# Patient Record
Sex: Male | Born: 1980 | Race: White | Hispanic: No | Marital: Married | Smoking: Former smoker
Health system: Southern US, Community
[De-identification: ages and names within clinical notes are randomized; demographics above are authoritative.]

## PROBLEM LIST (undated history)

## (undated) DIAGNOSIS — B029 Zoster without complications: Secondary | ICD-10-CM

## (undated) DIAGNOSIS — F419 Anxiety disorder, unspecified: Secondary | ICD-10-CM

## (undated) HISTORY — PX: NO PAST SURGERIES: SHX2092

## (undated) HISTORY — DX: Zoster without complications: B02.9

## (undated) HISTORY — DX: Anxiety disorder, unspecified: F41.9

---

## 2007-08-28 ENCOUNTER — Emergency Department: Payer: Self-pay | Admitting: Emergency Medicine

## 2008-07-10 ENCOUNTER — Inpatient Hospital Stay: Payer: Self-pay | Admitting: Psychiatry

## 2009-02-09 ENCOUNTER — Emergency Department (HOSPITAL_COMMUNITY): Admission: EM | Admit: 2009-02-09 | Discharge: 2009-02-09 | Payer: Self-pay | Admitting: Emergency Medicine

## 2010-08-31 LAB — POCT INFECTIOUS MONO SCREEN: Mono Screen: NEGATIVE

## 2010-08-31 LAB — POCT RAPID STREP A (OFFICE): Streptococcus, Group A Screen (Direct): NEGATIVE

## 2017-05-30 DIAGNOSIS — K529 Noninfective gastroenteritis and colitis, unspecified: Secondary | ICD-10-CM | POA: Insufficient documentation

## 2017-05-30 DIAGNOSIS — R112 Nausea with vomiting, unspecified: Secondary | ICD-10-CM | POA: Diagnosis present

## 2017-05-30 DIAGNOSIS — F17200 Nicotine dependence, unspecified, uncomplicated: Secondary | ICD-10-CM | POA: Diagnosis not present

## 2017-05-30 LAB — LIPASE, BLOOD: Lipase: 36 U/L (ref 11–51)

## 2017-05-30 LAB — CBC
HCT: 51.5 % (ref 40.0–52.0)
Hemoglobin: 17.2 g/dL (ref 13.0–18.0)
MCH: 31.7 pg (ref 26.0–34.0)
MCHC: 33.4 g/dL (ref 32.0–36.0)
MCV: 94.8 fL (ref 80.0–100.0)
PLATELETS: 321 10*3/uL (ref 150–440)
RBC: 5.43 MIL/uL (ref 4.40–5.90)
RDW: 12.3 % (ref 11.5–14.5)
WBC: 12.8 10*3/uL — AB (ref 3.8–10.6)

## 2017-05-30 LAB — COMPREHENSIVE METABOLIC PANEL
ALT: 21 U/L (ref 17–63)
AST: 21 U/L (ref 15–41)
Albumin: 4.2 g/dL (ref 3.5–5.0)
Alkaline Phosphatase: 136 U/L — ABNORMAL HIGH (ref 38–126)
Anion gap: 9 (ref 5–15)
BUN: 8 mg/dL (ref 6–20)
CO2: 29 mmol/L (ref 22–32)
Calcium: 8.9 mg/dL (ref 8.9–10.3)
Chloride: 104 mmol/L (ref 101–111)
Creatinine, Ser: 0.93 mg/dL (ref 0.61–1.24)
Glucose, Bld: 132 mg/dL — ABNORMAL HIGH (ref 65–99)
POTASSIUM: 5 mmol/L (ref 3.5–5.1)
Sodium: 142 mmol/L (ref 135–145)
Total Bilirubin: 0.4 mg/dL (ref 0.3–1.2)
Total Protein: 7.1 g/dL (ref 6.5–8.1)

## 2017-05-30 NOTE — ED Triage Notes (Signed)
Pt reports n/v/d x 6 days with increased gas.  Pt in NAD, ambulatory to triage.

## 2017-05-31 ENCOUNTER — Encounter: Payer: Self-pay | Admitting: Radiology

## 2017-05-31 ENCOUNTER — Emergency Department: Payer: Managed Care, Other (non HMO)

## 2017-05-31 ENCOUNTER — Emergency Department
Admission: EM | Admit: 2017-05-31 | Discharge: 2017-05-31 | Disposition: A | Discharge status: Home / Self Care | Payer: Managed Care, Other (non HMO) | Attending: Emergency Medicine | Admitting: Emergency Medicine

## 2017-05-31 DIAGNOSIS — R112 Nausea with vomiting, unspecified: Secondary | ICD-10-CM

## 2017-05-31 DIAGNOSIS — R1033 Periumbilical pain: Secondary | ICD-10-CM

## 2017-05-31 DIAGNOSIS — R197 Diarrhea, unspecified: Secondary | ICD-10-CM

## 2017-05-31 DIAGNOSIS — K529 Noninfective gastroenteritis and colitis, unspecified: Secondary | ICD-10-CM

## 2017-05-31 LAB — URINALYSIS, COMPLETE (UACMP) WITH MICROSCOPIC
Bacteria, UA: NONE SEEN
Bilirubin Urine: NEGATIVE
GLUCOSE, UA: NEGATIVE mg/dL
HGB URINE DIPSTICK: NEGATIVE
Ketones, ur: NEGATIVE mg/dL
Leukocytes, UA: NEGATIVE
NITRITE: NEGATIVE
PH: 5 (ref 5.0–8.0)
Protein, ur: NEGATIVE mg/dL
SPECIFIC GRAVITY, URINE: 1.023 (ref 1.005–1.030)
Squamous Epithelial / LPF: NONE SEEN

## 2017-05-31 LAB — GASTROINTESTINAL PANEL BY PCR, STOOL (REPLACES STOOL CULTURE)
ADENOVIRUS F40/41: NOT DETECTED
Astrovirus: NOT DETECTED
CAMPYLOBACTER SPECIES: NOT DETECTED
CRYPTOSPORIDIUM: NOT DETECTED
CYCLOSPORA CAYETANENSIS: NOT DETECTED
ENTEROPATHOGENIC E COLI (EPEC): NOT DETECTED
Entamoeba histolytica: NOT DETECTED
Enteroaggregative E coli (EAEC): NOT DETECTED
Enterotoxigenic E coli (ETEC): NOT DETECTED
Giardia lamblia: NOT DETECTED
Norovirus GI/GII: NOT DETECTED
PLESIMONAS SHIGELLOIDES: NOT DETECTED
ROTAVIRUS A: NOT DETECTED
SAPOVIRUS (I, II, IV, AND V): NOT DETECTED
SHIGA LIKE TOXIN PRODUCING E COLI (STEC): NOT DETECTED
Salmonella species: NOT DETECTED
Shigella/Enteroinvasive E coli (EIEC): NOT DETECTED
VIBRIO SPECIES: NOT DETECTED
Vibrio cholerae: NOT DETECTED
Yersinia enterocolitica: NOT DETECTED

## 2017-05-31 LAB — C DIFFICILE QUICK SCREEN W PCR REFLEX
C DIFFICILE (CDIFF) INTERP: NOT DETECTED
C Diff antigen: NEGATIVE
C Diff toxin: NEGATIVE

## 2017-05-31 MED ORDER — CIPROFLOXACIN HCL 500 MG PO TABS
500.0000 mg | ORAL_TABLET | Freq: Once | ORAL | Status: AC
  Administered 2017-05-31: 500 mg via ORAL
  Filled 2017-05-31: qty 1

## 2017-05-31 MED ORDER — IOPAMIDOL (ISOVUE-300) INJECTION 61%
100.0000 mL | Freq: Once | INTRAVENOUS | Status: AC | PRN
  Administered 2017-05-31: 100 mL via INTRAVENOUS

## 2017-05-31 MED ORDER — DICYCLOMINE HCL 20 MG PO TABS: 20.0000 mg | ORAL_TABLET | Freq: Four times a day (QID) | ORAL | 0 refills | Status: DC | PRN

## 2017-05-31 MED ORDER — METRONIDAZOLE 500 MG PO TABS: 500.0000 mg | ORAL_TABLET | Freq: Two times a day (BID) | ORAL | 0 refills | Status: DC

## 2017-05-31 MED ORDER — ONDANSETRON 4 MG PO TBDP: 4.0000 mg | ORAL_TABLET | Freq: Three times a day (TID) | ORAL | 0 refills | Status: DC | PRN

## 2017-05-31 MED ORDER — IOPAMIDOL (ISOVUE-300) INJECTION 61%
30.0000 mL | Freq: Once | INTRAVENOUS | Status: AC
  Administered 2017-05-31: 30 mL via ORAL

## 2017-05-31 MED ORDER — SODIUM CHLORIDE 0.9 % IV BOLUS (SEPSIS)
1000.0000 mL | Freq: Once | INTRAVENOUS | Status: AC
  Administered 2017-05-31: 1000 mL via INTRAVENOUS

## 2017-05-31 MED ORDER — CIPROFLOXACIN HCL 500 MG PO TABS: 500.0000 mg | ORAL_TABLET | Freq: Two times a day (BID) | ORAL | 0 refills | Status: DC

## 2017-05-31 MED ORDER — METRONIDAZOLE 500 MG PO TABS
500.0000 mg | ORAL_TABLET | Freq: Once | ORAL | Status: AC
Start: 2017-05-31 — End: 2017-05-31
  Administered 2017-05-31: 500 mg via ORAL
  Filled 2017-05-31: qty 1

## 2017-05-31 NOTE — Discharge Instructions (Signed)
1.  Take antibiotics as prescribed (Cipro/Flagyl 500 mg twice daily x 7 days). 2.  You may take medicines as needed for abdominal discomfort and nausea (Bentyl/Zofran #20). 3.  Clear liquids x 12 hours, then BRAT diet x 3 days, then slowly advance diet as tolerated. 4.  Return to the ER for worsening symptoms, persistent vomiting, difficulty breathing or other concerns.

## 2017-05-31 NOTE — ED Provider Notes (Signed)
[facility] Emergency Department Provider Note   ____________________________________________   First MD Initiated Contact with Patient 05/31/17 0245     (approximate)  I have reviewed the triage vital signs and the nursing notes.   HISTORY  Chief Complaint Emesis and Diarrhea    HPI Joseph Cohen is a 37 y.o. male who presents to the ED from home with a chief complaint of abdominal pain, nausea, vomiting and diarrhea.  Patient reports symptoms x 6 days; diarrhea worse than vomiting.  Traveled to [place] over the holidays and thought he picked up a stomach bug.  No other sick contacts.  Reports periumbilical cramping before onset of nausea and vomiting.  Denies associated fever, chills, chest pain, shortness of breath, dysuria.  Denies recent trauma or antibiotic use.   Past medical history None  There are no active problems to display for this patient.   History reviewed. No pertinent surgical history.  Prior to Admission medications   Not on File    Allergies Patient has no known allergies.  No family history on file.  Social History Social History   Tobacco Use  . Smoking status: Current Every Day Smoker  . Smokeless tobacco: Never Used  Substance Use Topics  . Alcohol use: No    Frequency: Never    Comment: no etoh x 1 year  . Drug use: No    Review of Systems  Constitutional: No fever/chills. Eyes: No visual changes. ENT: No sore throat. Cardiovascular: Denies chest pain. Respiratory: Denies shortness of breath. Gastrointestinal: Positive for abdominal pain, nausea, vomiting and diarrhea.  No constipation. Genitourinary: Negative for dysuria. Musculoskeletal: Negative for back pain. Skin: Negative for rash. Neurological: Negative for headaches, focal weakness or numbness.   ____________________________________________   PHYSICAL EXAM:  VITAL SIGNS: ED Triage Vitals  Enc Vitals Group     BP 05/30/17 2214  (!) 145/81     Pulse Rate 05/30/17 2214 62     Resp 05/30/17 2214 18     Temp 05/30/17 2214 98.2 F (36.8 C)     Temp Source 05/30/17 2214 Oral     SpO2 05/30/17 2214 100 %     Weight 05/30/17 2214 140 lb (63.5 kg)     Height --      Head Circumference --      Peak Flow --      Pain Score 05/30/17 2213 1     Pain Loc --      Pain Edu? --      Excl. in GC? --     Constitutional: Alert and oriented. Well appearing and in no acute distress. Eyes: Conjunctivae are normal. PERRL. EOMI. Head: Atraumatic. Nose: No congestion/rhinnorhea. Mouth/Throat: Mucous membranes are moist.  Oropharynx non-erythematous. Neck: No stridor.   Cardiovascular: Normal rate, regular rhythm. Grossly normal heart sounds.  Good peripheral circulation. Respiratory: Normal respiratory effort.  No retractions. Lungs CTAB. Gastrointestinal: Soft and minimally tender to palpation diffusely without rebound or guarding. No distention. No abdominal bruits. No CVA tenderness. Musculoskeletal: No lower extremity tenderness nor edema.  No joint effusions. Neurologic:  Normal speech and language. No gross focal neurologic deficits are appreciated. No gait instability. Skin:  Skin is warm, dry and intact. No rash noted. Psychiatric: Mood and affect are normal. Speech and behavior are normal.  ____________________________________________   LABS (all labs ordered are listed, but only abnormal results are displayed)  Labs Reviewed  COMPREHENSIVE METABOLIC PANEL - Abnormal; Notable for the following components:  Result Value   Glucose, Bld 132 (*)    Alkaline Phosphatase 136 (*)    All other components within normal limits  CBC - Abnormal; Notable for the following components:   WBC 12.8 (*)    All other components within normal limits  URINALYSIS, COMPLETE (UACMP) WITH MICROSCOPIC - Abnormal; Notable for the following components:   Color, Urine AMBER (*)    APPearance CLEAR (*)    All other components within  normal limits  C DIFFICILE QUICK SCREEN W PCR REFLEX  GASTROINTESTINAL PANEL BY PCR, STOOL (REPLACES STOOL CULTURE)  LIPASE, BLOOD   ____________________________________________  EKG  None ____________________________________________  RADIOLOGY  Ct Abdomen Pelvis W Contrast  Result Date: 05/31/2017 CLINICAL DATA:  Nausea, vomiting, and diarrhea for 6 days. Increased gas and epigastric discomfort. EXAM: CT ABDOMEN AND PELVIS WITH CONTRAST TECHNIQUE: Multidetector CT imaging of the abdomen and pelvis was performed using the standard protocol following bolus administration of intravenous contrast. CONTRAST:  100mL ISOVUE-300 IOPAMIDOL (ISOVUE-300) INJECTION 61% COMPARISON:  None. FINDINGS: Lower chest: Lung bases are clear. Hepatobiliary: Mild diffuse fatty infiltration of the liver. No focal lesions. Gallbladder and bile ducts are unremarkable. Pancreas: Unremarkable. No pancreatic ductal dilatation or surrounding inflammatory changes. Spleen: Normal in size without focal abnormality. Adrenals/Urinary Tract: Adrenal glands are unremarkable. Kidneys are normal, without renal calculi, focal lesion, or hydronephrosis. Bladder is unremarkable. Stomach/Bowel: Stomach, small bowel, and colon are not abnormally distended. Fluid-filled colon may be related to history of diarrhea. There is mild diffuse colonic wall thickening which likely indicates colitis. This could be infectious or inflammatory in etiology. Contrast material flows through the small bowel into the colon without evidence of small bowel obstruction. No small bowel wall thickening. Vascular/Lymphatic: No significant vascular findings are present. No pathologically enlarged abdominal or pelvic lymph nodes. Prominent mesenteric nodes are likely reactive. Reproductive: Prostate is unremarkable. Other: No abdominal wall hernia or abnormality. No abdominopelvic ascites. Musculoskeletal: No acute or significant osseous findings. IMPRESSION: Mild  diffuse colonic wall thickening with fluid-filled colon may indicate colitis. Consider infectious or inflammatory etiologies. No evidence of bowel obstruction. Mild diffuse fatty infiltration of the liver. Electronically Signed   By: Burman NievesWilliam  Stevens M.D.   On: 05/31/2017 03:56    ____________________________________________   PROCEDURES  Procedure(s) performed: None  Procedures  Critical Care performed: No  ____________________________________________   INITIAL IMPRESSION / ASSESSMENT AND PLAN / ED COURSE  As part of my medical decision making, I reviewed the following data within the electronic MEDICAL RECORD NUMBER History obtained from family, Nursing notes reviewed and incorporated, Labs reviewed, Old chart reviewed, Radiograph reviewed  and Notes from prior ED visits.   37 year old male who presents with a 6-day history of nausea, vomiting and diarrhea associated with abdominal cramps. Differential diagnosis includes, but is not limited to, acute appendicitis, renal colic, testicular torsion, urinary tract infection/pyelonephritis, prostatitis,  epididymitis, diverticulitis, small bowel obstruction or ileus, colitis, abdominal aortic aneurysm, gastroenteritis, hernia, etc.  Laboratory results notable for mild leukocytosis.  C. difficile and biofire stool panel ordered.  Although patient clinically appears well, given the duration of his symptoms, will obtain CT abdomen/pelvis to evaluate for intra-abdominal etiology of patient's symptoms.  Clinical Course as of May 31 538  Sat May 31, 2017  [id number] Patient resting in no acute distress.  Updated patient and spouse of laboratory and imaging results.  Patient was able to tolerate oral contrast without emesis.  Will initiate Cipro and Flagyl; prescriptions for analgesia and antiemetic to use as  needed.  Strict return precautions given.  Both verbalize understanding and agree with plan of care.  [JS]    Clinical Course User Index [JS] Irean Hong, MD     ____________________________________________   FINAL CLINICAL IMPRESSION(S) / ED DIAGNOSES  Final diagnoses:  Nausea vomiting and diarrhea  Periumbilical abdominal pain  Colitis     ED Discharge Orders    None       Note:  This document was prepared using Dragon voice recognition software and may include unintentional dictation errors.    Irean Hong, MD 05/31/17 0600

## 2017-05-31 NOTE — ED Notes (Signed)
Patient transported to CT 

## 2017-12-11 ENCOUNTER — Ambulatory Visit (INDEPENDENT_AMBULATORY_CARE_PROVIDER_SITE_OTHER): Payer: Managed Care, Other (non HMO) | Admitting: Family Medicine

## 2017-12-11 ENCOUNTER — Encounter: Payer: Self-pay | Admitting: Family Medicine

## 2017-12-11 VITALS — BP 112/76 | HR 59 | Temp 97.3°F | Resp 16 | Ht 68.5 in | Wt 142.0 lb

## 2017-12-11 DIAGNOSIS — F17200 Nicotine dependence, unspecified, uncomplicated: Secondary | ICD-10-CM

## 2017-12-11 DIAGNOSIS — Z23 Encounter for immunization: Secondary | ICD-10-CM

## 2017-12-11 DIAGNOSIS — Z13220 Encounter for screening for lipoid disorders: Secondary | ICD-10-CM

## 2017-12-11 DIAGNOSIS — B0229 Other postherpetic nervous system involvement: Secondary | ICD-10-CM | POA: Diagnosis not present

## 2017-12-11 DIAGNOSIS — Z114 Encounter for screening for human immunodeficiency virus [HIV]: Secondary | ICD-10-CM | POA: Diagnosis not present

## 2017-12-11 DIAGNOSIS — M720 Palmar fascial fibromatosis [Dupuytren]: Secondary | ICD-10-CM | POA: Diagnosis not present

## 2017-12-11 NOTE — Patient Instructions (Addendum)
Dupuytren Contracture Dupuytren contracture is a condition in which tissue under the skin of the palm becomes abnormally thickened. This causes one or more of the fingers to curl inward (contract) toward the palm. Eventually, the fingers may not be able to straighten out. This condition affects some or all of the fingers and the palm of the hand. It is often passed along from parent to child (inherited). Dupuytren contracture is a long-term (chronic) condition that develops (progresses) slowly over time. There is no cure, but symptoms can be managed and progression can be slowed with treatment. This condition is usually not dangerous or painful, but it can interfere with everyday tasks. What are the causes? This condition is caused by tissue (fascia) in the palm getting thicker and tighter. When the fascia thickens, it pulls on the cords of tissue (tendons) that control finger movement. This causes the fingers to contract. The cause of fascia thickening is not known. What increases the risk? This condition may be more likely to develop in:  People who are age 40 or older.  Men.  People with a family history of this condition.  People who use tobacco products, including cigarettes, chewing tobacco, and e-cigarettes.  People who drink alcohol excessively.  People with diabetes.  People with autoimmune diseases, such as HIV.  People with seizure disorders.  What are the signs or symptoms? Symptoms may develop in one or both hands. Any of the fingers can contract. The fingers farthest from the thumb are commonly affected. Usually, this condition is painless. You may have discomfort when holding or grabbing objects. Early symptoms of this condition may include:  Thick, puckered skin on the hand.  One or more lumps (nodules) on the palm. Nodules may be tender when they first appear, but they are generally painless.  Symptoms of this condition develop slowly over months or years. Later  symptoms of this condition may include:  Thick cords of tissue in the palm.  Fingers curled up toward the palm.  Inability to straighten the fingers into their normal position.  How is this diagnosed? This condition is diagnosed with a physical exam, which may include:  Looking at your hands and feeling your hands. This is to check for thickened fascia and nodules.  Measuring finger motion.  Doing the [place] Tabletop Test. You may be asked to try to put your hand on a surface, with your palm down and your fingers straight out.  How is this treated? There is no cure for this condition, but treatment can make symptoms more manageable and relieve discomfort. Treatment options may include:  Physical therapy. This can strengthen your hand and increase flexibility.  Occupational therapy. This can help you with everyday tasks that may be more difficult because of your condition.  A hand splint.  Shots (injections). Substances may be injected into your hand, such as: ? Medicines that help to decrease swelling (corticosteroids). ? Proteins (collagenase) to weaken thick tissue. After a collagenase injection, your health care provider may stretch your fingers.  Needle aponeurotomy. In this procedure, a needle is pushed through the skin and into the fascia. Moving the needle against the fascia can weaken or break up the thick tissue.  Surgery. This may be needed if your condition causes discomfort or interferes with everyday activities. Physical therapy is usually needed after surgery.  In some cases, symptoms never develop to the point of needing major treatment, and caring for yourself at home can be enough to manage your condition. Symptoms often   return after treatment. Follow these instructions at home: If you have a splint:  Do not put pressure on any part of the splint until it is fully hardened. This may take several hours.  Wear the splint as told by your health care provider.  Remove it only as told by your health care provider.  Loosen the splint if your fingers tingle, become numb, or turn cold and blue.  Do not let your splint get wet if it is not waterproof. ? If your splint is not waterproof, cover it with a watertight covering when you take a bath or a shower. ? Do not take baths, swim, or use a hot tub until your health care provider approves. Ask your health care provider if you can take showers. You may only be allowed to take sponge baths for bathing.  Keep the splint clean.  Ask your health care provider when it is safe to drive. Hand Care  Take these actions to help protect your hand from possible injury: ? Use tools that have padded grips. ? Wear protective gloves while you work with your hands. ? Avoid repetitive hand movements.  Avoid actions that cause pain or discomfort.  Stretch your hand by gently pulling your fingers backward toward your wrist. Do this as often as is comfortable. Stop if this causes pain.  Gently massage your hand as often as is comfortable.  If directed, apply heat to the affected area as often as told by your health care provider. Use the heat source that your health care provider recommends, such as a moist heat pack or a heating pad. ? Place a towel between your skin and the heat source. ? Leave the heat on for 20-30 minutes. ? Remove the heat if your skin turns bright red. This is especially important if you are unable to feel pain, heat, or cold. You may have a greater risk of getting burned. General instructions  Take over-the-counter and prescription medicines only as told by your health care provider.  Manage any other conditions that you have, such as diabetes.  If physical therapy was prescribed, do exercises as told by your health care provider.  Keep all follow-up visits as told by your health care provider. This is important. Contact a health care provider if:  You develop new symptoms, or your  symptoms get worse.  You have pain that gets worse or does not get better with medicine.  You have difficulty or discomfort with everyday tasks.  You have problems with your splint.  You develop numbness or tingling. Get help right away if:  You have severe pain.  Your fingers change color or become unusually cold. This information is not intended to replace advice given to you by your health care provider. Make sure you discuss any questions you have with your health care provider. Document Released: 03/10/2009 Document Revised: 06/27/2015 Document Reviewed: 10/05/2014 Elsevier Interactive Patient Education  2018 Elsevier Inc.  

## 2017-12-11 NOTE — Assessment & Plan Note (Signed)
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

## 2017-12-11 NOTE — Assessment & Plan Note (Signed)
Encourage patient on recently quitting smoking Continue to stepdown on nicotine therapy and eventually quit Patient also plans to eventually quit vaping Continue to monitor

## 2017-12-11 NOTE — Progress Notes (Signed)
Patient: Joseph Cohen, Male    DOB: 01/13/1981, 37 y.o.   MRN: [id number] Visit Date: 12/11/2017  Today's Provider: Shirlee LatchAngela Arijana Narayan, MD   I, Joslyn HyEmily Ratchford, CMA, am acting as scribe for Shirlee LatchAngela Mohogany Toppins, MD.  Chief Complaint  Patient presents with  . New Patient (Initial Visit)   Subjective:    Establish Care Joseph Cohen is a 37 y.o. male who presents today as a new patient to establish care. He feels fairly well. He reports exercising none, outside of work. He reports he is sleeping well.  He states that he has not seen a doctor in many years.  He is not sure when he last had a tetanus shot but believes it may have been when he was a child.  Pt is concerned about a "growth" in his left palm. Seems to have been getting larger for last 3 years.  Feels better with stretching.  No contractures or catching of finger with flexion/extension.  Non-tender.  He also recently had shingles on his right neck.  Was initially treated for a spider bite and then more rash broke out.  He was then treated with Valtrex.  It is no longer painful, but he has some intermittent numbness and pins-and-needles feelings.  He denies any hearing loss or residual rash.  Patient has smoked cigarettes for about 20 years.  He smoked about 1 pack/day.  He quit smoking about 5 weeks ago.  He is on step 2 of the nicotine patches which is 14 mg daily.  He does occasionally still vape. -----------------------------------------------------------------   Review of Systems  Constitutional: Negative.   HENT: Negative.   Eyes: Negative.   Respiratory: Negative.   Cardiovascular: Negative.   Gastrointestinal: Negative.   Endocrine: Negative.   Genitourinary: Negative.   Musculoskeletal: Negative.   Skin: Negative.   Allergic/Immunologic: Negative.   Neurological: Negative.   Hematological: Negative.   Psychiatric/Behavioral: Negative.     Social History      He  reports that he quit smoking  about 4 weeks ago. His smoking use included cigarettes. He has a 20.00 pack-year smoking history. He has never used smokeless tobacco. He reports that he drank alcohol. He reports that he does not use drugs.       Social History   Socioeconomic History  . Marital status: Single    Spouse name: Not on file  . Number of children: 1  . Years of education: Not on file  . Highest education level: GED or equivalent  Occupational History    Employer: [employer]  Social Needs  . Financial resource strain: Not on file  . Food insecurity:    Worry: Not on file    Inability: Not on file  . Transportation needs:    Medical: Not on file    Non-medical: Not on file  Tobacco Use  . Smoking status: Former Smoker    Packs/day: 1.00    Years: 20.00    Pack years: 20.00    Types: Cigarettes    Last attempt to quit: 11/11/2017    Years since quitting: 0.0  . Smokeless tobacco: Never Used  Substance and Sexual Activity  . Alcohol use: Not Currently    Frequency: Never  . Drug use: No  . Sexual activity: Yes    Partners: Female    Birth control/protection: None  Lifestyle  . Physical activity:    Days per week: Not on file    Minutes per session: Not  on file  . Stress: Not on file  Relationships  . Social connections:    Talks on phone: Not on file    Gets together: Not on file    Attends religious service: Not on file    Active member of club or organization: Not on file    Attends meetings of clubs or organizations: Not on file    Relationship status: Not on file  Other Topics Concern  . Not on file  Social History Narrative  . Not on file    Past Medical History:  Diagnosis Date  . Zoster without complications      Patient Active Problem List   Diagnosis Date Noted  . Postherpetic neuralgia 12/11/2017  . Dupuytren's disease of palm of left hand 12/11/2017  . Tobacco use disorder 12/11/2017    Past Surgical History:  Procedure Laterality Date  . NO  PAST SURGERIES      Family History        Family Status  Relation Name Status  . Mother  Alive  . Father  Alive  . Sister  Alive  . Sister  Alive  . MGM  (Not Specified)  . MGF  (Not Specified)  . PGM  (Not Specified)  . PGF  (Not Specified)  . Neg Hx  (Not Specified)        His family history includes Breast cancer in his maternal grandmother; Dementia in his maternal grandfather, maternal grandmother, paternal grandfather, and paternal grandmother; Healthy in his father, mother, sister, and sister. There is no history of Colon cancer or Prostate cancer.      No Known Allergies   Current Outpatient Medications:  .  nicotine (NICODERM CQ - DOSED IN MG/24 HOURS) 14 mg/24hr patch, Place 14 mg onto the skin daily., Disp: , Rfl:    Patient Care Team: Erasmo Downer, MD as PCP - General (Family Medicine)      Objective:   Vitals: BP 112/76 (BP Location: Left Arm, Patient Position: Sitting, Cuff Size: Normal)   Pulse (!) 59   Temp (!) 97.3 F (36.3 C) (Oral)   Resp 16   Ht 5' 8.5" (1.74 m)   Wt 142 lb (64.4 kg)   SpO2 99%   BMI 21.28 kg/m    Vitals:   12/11/17 1515  BP: 112/76  Pulse: (!) 59  Resp: 16  Temp: (!) 97.3 F (36.3 C)  TempSrc: Oral  SpO2: 99%  Weight: 142 lb (64.4 kg)  Height: 5' 8.5" (1.74 m)     Physical Exam  Constitutional: He is oriented to person, place, and time. He appears well-developed and well-nourished. No distress.  HENT:  Head: Normocephalic and atraumatic.  Right Ear: External ear normal.  Left Ear: External ear normal.  Nose: Nose normal.  Mouth/Throat: Oropharynx is clear and moist.  Eyes: Pupils are equal, round, and reactive to light. Conjunctivae and EOM are normal. No scleral icterus.  Neck: Neck supple. No thyromegaly present.  Cardiovascular: Normal rate, regular rhythm, normal heart sounds and intact distal pulses.  No murmur heard. Pulmonary/Chest: Effort normal and breath sounds normal. No respiratory distress.  He has no wheezes. He has no rales.  Abdominal: Soft. Bowel sounds are normal. He exhibits no distension. There is no tenderness. There is no rebound and no guarding.  Musculoskeletal: He exhibits no edema.  3 nodules over L palm below ring finger that are non-mobile.  Lymphadenopathy:    He has no cervical adenopathy.  Neurological: He is alert  and oriented to person, place, and time. A sensory deficit (over area of previous shingles) is present. No cranial nerve deficit. He exhibits normal muscle tone.  Skin: Skin is warm and dry. Capillary refill takes less than 2 seconds. No rash noted.  Psychiatric: He has a normal mood and affect. His behavior is normal.  Vitals reviewed.    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 Referral to hand surgery for possible corticosteroid injection and for further management      Relevant Orders   Ambulatory referral to Hand Surgery     Other   Tobacco use disorder    Encourage patient on recently quitting smoking Continue to stepdown on nicotine therapy and eventually quit Patient also plans to eventually quit vaping Continue to monitor      Relevant Orders   Comprehensive metabolic panel    Other Visit Diagnoses    Screening for HIV (human immunodeficiency virus)       Relevant Orders   HIV antibody (with reflex)   Screening for lipid disorders       Relevant Orders   Lipid panel   Comprehensive metabolic  panel   Need for Tdap vaccination       Relevant Orders   Tdap vaccine greater than or equal to 7yo IM (Completed)       Return in about 1 year (around 12/12/2018) for CPE.   The entirety of the information documented in the History of Present Illness, Review of Systems and Physical Exam were personally obtained by me. Portions of this information were initially documented by Irving Burton Ratchford, CMA and reviewed by me for thoroughness and accuracy.    Erasmo Downer, MD, MPH [facility] 12/11/2017 4:54 PM

## 2017-12-11 NOTE — Assessment & Plan Note (Signed)
Patient with mild post-herpetic neuralgia Offered gabapentin, but does not think it is severe enough to take a medication No cranial nerve deficits or hearing difficulty F/u prn

## 2017-12-13 LAB — COMPREHENSIVE METABOLIC PANEL
A/G RATIO: 2.4 — AB (ref 1.2–2.2)
ALK PHOS: 93 IU/L (ref 39–117)
ALT: 18 IU/L (ref 0–44)
AST: 15 IU/L (ref 0–40)
Albumin: 4.8 g/dL (ref 3.5–5.5)
BUN/Creatinine Ratio: 11 (ref 9–20)
BUN: 10 mg/dL (ref 6–20)
Bilirubin Total: 0.6 mg/dL (ref 0.0–1.2)
CHLORIDE: 105 mmol/L (ref 96–106)
CO2: 22 mmol/L (ref 20–29)
Calcium: 9.2 mg/dL (ref 8.7–10.2)
Creatinine, Ser: 0.89 mg/dL (ref 0.76–1.27)
GFR calc Af Amer: 126 mL/min/{1.73_m2} (ref >59)
GFR calc non Af Amer: 109 mL/min/{1.73_m2} (ref >59)
Globulin, Total: 2 g/dL (ref 1.5–4.5)
Glucose: 91 mg/dL (ref 65–99)
POTASSIUM: 4.5 mmol/L (ref 3.5–5.2)
SODIUM: 142 mmol/L (ref 134–144)
Total Protein: 6.8 g/dL (ref 6.0–8.5)

## 2017-12-13 LAB — LIPID PANEL
CHOLESTEROL TOTAL: 217 mg/dL — AB (ref 100–199)
Chol/HDL Ratio: 4.6 ratio (ref 0.0–5.0)
HDL: 47 mg/dL (ref >39)
LDL CALC: 142 mg/dL — AB (ref 0–99)
TRIGLYCERIDES: 140 mg/dL (ref 0–149)
VLDL CHOLESTEROL CAL: 28 mg/dL (ref 5–40)

## 2017-12-13 LAB — HIV ANTIBODY (ROUTINE TESTING W REFLEX): HIV SCREEN 4TH GENERATION: NONREACTIVE

## 2017-12-15 ENCOUNTER — Telehealth: Payer: Self-pay

## 2017-12-15 NOTE — Telephone Encounter (Signed)
-----   Message from Joseph DownerAngela M Bacigalupo, MD sent at 12/15/2017  8:19 AM EDT ----- Cholesterol is high, but 10 year risk of heart disease/stroke is low at 2.6%.  No medication indicated. Recommend regular exercise (30min 5x/wk) and diet low in saturated fats.  Negative HIV screening.  Normal kidney function, liver function, electrolytes.  Joseph DownerBacigalupo, Angela M, MD, MPH [facility] 12/15/2017 8:19 AM

## 2017-12-15 NOTE — Telephone Encounter (Signed)
Pt advised.

## 2018-12-18 ENCOUNTER — Ambulatory Visit: Payer: Managed Care, Other (non HMO) | Admitting: Family Medicine

## 2018-12-18 ENCOUNTER — Encounter: Payer: Self-pay | Admitting: Family Medicine

## 2018-12-25 ENCOUNTER — Encounter: Payer: Managed Care, Other (non HMO) | Admitting: Physician Assistant

## 2019-01-13 ENCOUNTER — Other Ambulatory Visit: Payer: Self-pay

## 2019-01-13 ENCOUNTER — Ambulatory Visit (INDEPENDENT_AMBULATORY_CARE_PROVIDER_SITE_OTHER): Payer: Managed Care, Other (non HMO) | Admitting: Family Medicine

## 2019-01-13 ENCOUNTER — Encounter: Payer: Self-pay | Admitting: Family Medicine

## 2019-01-13 VITALS — BP 120/80 | HR 71 | Temp 98.7°F | Ht 68.0 in | Wt 148.4 lb

## 2019-01-13 DIAGNOSIS — Z0000 Encounter for general adult medical examination without abnormal findings: Secondary | ICD-10-CM

## 2019-01-13 DIAGNOSIS — F410 Panic disorder [episodic paroxysmal anxiety] without agoraphobia: Secondary | ICD-10-CM | POA: Diagnosis not present

## 2019-01-13 NOTE — Progress Notes (Signed)
Patient: Joseph Cohen, Male    DOB: Jul 28, 1980, 38 y.o.   MRN: [id number] Visit Date: 01/13/2019  Today's Provider: Shirlee LatchAngela Komal Stangelo, MD   Chief Complaint  Patient presents with  . Annual Exam   Subjective:  I, Joseph Cohen CMA, am acting as a scribe for Shirlee LatchAngela Corrina Steffensen, MD.    Annual physical exam Joseph Cohen is a 38 y.o. male who presents today for health maintenance and complete physical. He feels well. He reports exercising includes walking. He reports he is sleeping well.  -----------------------------------------------------------------  Panic attack in some situtions Large groups of people Occurring Pre-COVID Able to remove himself from the situation and it Gets better  Nicotine patch step 2 currently Relapses some Quit smoking 2 months ago   Review of Systems  Constitutional: Negative.   HENT: Negative.   Eyes: Negative.   Cardiovascular: Negative.   Gastrointestinal: Negative.   Endocrine: Negative.   Genitourinary: Negative.   Musculoskeletal: Negative.   Skin: Negative.   Allergic/Immunologic: Negative.   Neurological: Negative.   Hematological: Negative.   Psychiatric/Behavioral: The patient is nervous/anxious.     Social History He  reports that he quit smoking about 14 months ago. His smoking use included cigarettes. He has a 20.00 pack-year smoking history. He has never used smokeless tobacco. He reports previous alcohol use. He reports that he does not use drugs. Social History   Socioeconomic History  . Marital status: Single    Spouse name: Not on file  . Number of children: 1  . Years of education: Not on file  . Highest education level: GED or equivalent  Occupational History    Employer: [employer]  Social Needs  . Financial resource strain: Not on file  . Food insecurity    Worry: Not on file    Inability: Not on file  . Transportation needs    Medical: Not on file    Non-medical: Not on  file  Tobacco Use  . Smoking status: Former Smoker    Packs/day: 1.00    Years: 20.00    Pack years: 20.00    Types: Cigarettes    Quit date: 11/11/2017    Years since quitting: 1.1  . Smokeless tobacco: Never Used  Substance and Sexual Activity  . Alcohol use: Not Currently    Frequency: Never  . Drug use: No  . Sexual activity: Yes    Partners: Female    Birth control/protection: None  Lifestyle  . Physical activity    Days per week: Not on file    Minutes per session: Not on file  . Stress: Not on file  Relationships  . Social Musicianconnections    Talks on phone: Not on file    Gets together: Not on file    Attends religious service: Not on file    Active member of club or organization: Not on file    Attends meetings of clubs or organizations: Not on file    Relationship status: Not on file  Other Topics Concern  . Not on file  Social History Narrative  . Not on file    Patient Active Problem List   Diagnosis Date Noted  . Postherpetic neuralgia 12/11/2017  . Dupuytren's disease of palm of left hand 12/11/2017  . Tobacco use disorder 12/11/2017    Past Surgical History:  Procedure Laterality Date  . NO PAST SURGERIES      Family History  Family Status  Relation Name Status  .  Mother  Alive  . Father  Alive  . Sister  Alive  . Sister  Alive  . MGM  (Not Specified)  . MGF  (Not Specified)  . PGM  (Not Specified)  . PGF  (Not Specified)  . Neg Hx  (Not Specified)   His family history includes Breast cancer in his maternal grandmother; Dementia in his maternal grandfather, maternal grandmother, paternal grandfather, and paternal grandmother; Healthy in his father, mother, sister, and sister.     No Known Allergies  Previous Medications   NICOTINE (NICODERM CQ - DOSED IN MG/24 HOURS) 14 MG/24HR PATCH    Place 14 mg onto the skin daily.    Patient Care Team: Virginia Crews, MD as PCP - General (Family Medicine)      Objective:   Vitals: BP  120/80 (BP Location: Right Arm, Patient Position: Sitting, Cuff Size: Normal)   Pulse 71   Temp 98.7 F (37.1 C) (Oral)   Ht 5\' 8"  (1.727 m)   Wt 148 lb 6.4 oz (67.3 kg)   SpO2 99%   BMI 22.56 kg/m    Physical Exam Vitals signs reviewed.  Constitutional:      General: He is not in acute distress.    Appearance: Normal appearance. He is well-developed. He is not diaphoretic.  HENT:     Head: Normocephalic and atraumatic.     Right Ear: Tympanic membrane, ear canal and external ear normal.     Left Ear: Tympanic membrane, ear canal and external ear normal.  Eyes:     General: No scleral icterus.    Extraocular Movements: Extraocular movements intact.     Conjunctiva/sclera: Conjunctivae normal.     Pupils: Pupils are equal, round, and reactive to light.  Neck:     Musculoskeletal: Neck supple.     Thyroid: No thyromegaly.  Cardiovascular:     Rate and Rhythm: Normal rate and regular rhythm.     Pulses: Normal pulses.     Heart sounds: Normal heart sounds. No murmur.  Pulmonary:     Effort: Pulmonary effort is normal. No respiratory distress.     Breath sounds: Normal breath sounds. No wheezing or rales.  Abdominal:     General: Bowel sounds are normal. There is no distension.     Palpations: Abdomen is soft.     Tenderness: There is no abdominal tenderness. There is no guarding or rebound.  Musculoskeletal:        General: No deformity.     Right lower leg: No edema.     Left lower leg: No edema.  Lymphadenopathy:     Cervical: No cervical adenopathy.  Skin:    General: Skin is warm and dry.     Capillary Refill: Capillary refill takes less than 2 seconds.     Findings: No rash.  Neurological:     Mental Status: He is alert and oriented to person, place, and time. Mental status is at baseline.  Psychiatric:        Mood and Affect: Mood normal.        Behavior: Behavior normal.        Thought Content: Thought content normal.      Depression Screen PHQ 2/9  Scores 01/13/2019 12/11/2017  PHQ - 2 Score 0 0  PHQ- 9 Score 0 -      Assessment & Plan:     Routine Health Maintenance and Physical Exam  Exercise Activities and Dietary recommendations Goals   None  Immunization History  Administered Date(s) Administered  . Tdap 12/11/2017    Health Maintenance  Topic Date Due  . INFLUENZA VACCINE  12/26/2018  . TETANUS/TDAP  12/12/2027  . HIV Screening  Completed     Discussed health benefits of physical activity, and encouraged him to engage in regular exercise appropriate for his age and condition.     Congratulated patient on quitting smoking --------------------------------------------------------------------  Problem List Items Addressed This Visit      Other   Panic attack    Only occurs in certain situations and able to avoid those Does not impact life Discussed therapy/medicaitons if becomes impactful on life       Other Visit Diagnoses    Encounter for annual physical exam    -  Primary   Relevant Orders   Lipid panel   Comprehensive metabolic panel       Return in about 1 year (around 01/13/2020) for CPE.   The entirety of the information documented in the History of Present Illness, Review of Systems and Physical Exam were personally obtained by me. Portions of this information were initially documented by [facility], CMA and reviewed by me for thoroughness and accuracy.    Solae Norling, Marzella SchleinAngela M, MD MPH [facility]

## 2019-01-13 NOTE — Assessment & Plan Note (Signed)
Only occurs in certain situations and able to avoid those Does not impact life Discussed therapy/medicaitons if becomes impactful on life

## 2019-01-13 NOTE — Patient Instructions (Signed)
Preventive Care 19-38 Years Old, Male Preventive care refers to lifestyle choices and visits with your health care provider that can promote health and wellness. This includes:  A yearly physical exam. This is also called an annual well check.  Regular dental and eye exams.  Immunizations.  Screening for certain conditions.  Healthy lifestyle choices, such as eating a healthy diet, getting regular exercise, not using drugs or products that contain nicotine and tobacco, and limiting alcohol use. What can I expect for my preventive care visit? Physical exam Your health care provider will check:  Height and weight. These may be used to calculate body mass index (BMI), which is a measurement that tells if you are at a healthy weight.  Heart rate and blood pressure.  Your skin for abnormal spots. Counseling Your health care provider may ask you questions about:  Alcohol, tobacco, and drug use.  Emotional well-being.  Home and relationship well-being.  Sexual activity.  Eating habits.  Work and work Statistician. What immunizations do I need?  Influenza (flu) vaccine  This is recommended every year. Tetanus, diphtheria, and pertussis (Tdap) vaccine  You may need a Td booster every 10 years. Varicella (chickenpox) vaccine  You may need this vaccine if you have not already been vaccinated. Human papillomavirus (HPV) vaccine  If recommended by your health care provider, you may need three doses over 6 months. Measles, mumps, and rubella (MMR) vaccine  You may need at least one dose of MMR. You may also need a second dose. Meningococcal conjugate (MenACWY) vaccine  One dose is recommended if you are 45-76 years old and a Market researcher living in a residence hall, or if you have one of several medical conditions. You may also need additional booster doses. Pneumococcal conjugate (PCV13) vaccine  You may need this if you have certain conditions and were not  previously vaccinated. Pneumococcal polysaccharide (PPSV23) vaccine  You may need one or two doses if you smoke cigarettes or if you have certain conditions. Hepatitis A vaccine  You may need this if you have certain conditions or if you travel or work in places where you may be exposed to hepatitis A. Hepatitis B vaccine  You may need this if you have certain conditions or if you travel or work in places where you may be exposed to hepatitis B. Haemophilus influenzae type b (Hib) vaccine  You may need this if you have certain risk factors. You may receive vaccines as individual doses or as more than one vaccine together in one shot (combination vaccines). Talk with your health care provider about the risks and benefits of combination vaccines. What tests do I need? Blood tests  Lipid and cholesterol levels. These may be checked every 5 years starting at age 17.  Hepatitis C test.  Hepatitis B test. Screening   Diabetes screening. This is done by checking your blood sugar (glucose) after you have not eaten for a while (fasting).  Sexually transmitted disease (STD) testing. Talk with your health care provider about your test results, treatment options, and if necessary, the need for more tests. Follow these instructions at home: Eating and drinking   Eat a diet that includes fresh fruits and vegetables, whole grains, lean protein, and low-fat dairy products.  Take vitamin and mineral supplements as recommended by your health care provider.  Do not drink alcohol if your health care provider tells you not to drink.  If you drink alcohol: ? Limit how much you have to 0-2  drinks a day. ? Be aware of how much alcohol is in your drink. In the [place] one drink equals one 12 oz bottle of beer (355 mL), one 5 oz glass of wine (148 mL), or one 1 oz glass of hard liquor (44 mL). Lifestyle  Take daily care of your teeth and gums.  Stay active. Exercise for at least 30 minutes on 5 or  more days each week.  Do not use any products that contain nicotine or tobacco, such as cigarettes, e-cigarettes, and chewing tobacco. If you need help quitting, ask your health care provider.  If you are sexually active, practice safe sex. Use a condom or other form of protection to prevent STIs (sexually transmitted infections). What's next?  Go to your health care provider once a year for a well check visit.  Ask your health care provider how often you should have your eyes and teeth checked.  Stay up to date on all vaccines. This information is not intended to replace advice given to you by your health care provider. Make sure you discuss any questions you have with your health care provider. Document Released: 07/09/2001 Document Revised: 05/07/2018 Document Reviewed: 05/07/2018 Elsevier Patient Education  2020 [employer]vier Inc.  

## 2019-01-15 ENCOUNTER — Telehealth: Payer: Self-pay

## 2019-01-15 LAB — COMPREHENSIVE METABOLIC PANEL
ALT: 37 IU/L (ref 0–44)
AST: 21 IU/L (ref 0–40)
Albumin/Globulin Ratio: 2.4 — ABNORMAL HIGH (ref 1.2–2.2)
Albumin: 4.6 g/dL (ref 4.0–5.0)
Alkaline Phosphatase: 101 IU/L (ref 39–117)
BUN/Creatinine Ratio: 11 (ref 9–20)
BUN: 12 mg/dL (ref 6–20)
Bilirubin Total: 0.4 mg/dL (ref 0.0–1.2)
CO2: 22 mmol/L (ref 20–29)
Calcium: 8.9 mg/dL (ref 8.7–10.2)
Chloride: 105 mmol/L (ref 96–106)
Creatinine, Ser: 1.07 mg/dL (ref 0.76–1.27)
GFR calc Af Amer: 101 mL/min/{1.73_m2} (ref >59)
GFR calc non Af Amer: 88 mL/min/{1.73_m2} (ref >59)
Globulin, Total: 1.9 g/dL (ref 1.5–4.5)
Glucose: 93 mg/dL (ref 65–99)
Potassium: 4.7 mmol/L (ref 3.5–5.2)
Sodium: 142 mmol/L (ref 134–144)
Total Protein: 6.5 g/dL (ref 6.0–8.5)

## 2019-01-15 LAB — LIPID PANEL
Chol/HDL Ratio: 4.7 ratio (ref 0.0–5.0)
Cholesterol, Total: 226 mg/dL — ABNORMAL HIGH (ref 100–199)
HDL: 48 mg/dL (ref >39)
LDL Calculated: 143 mg/dL — ABNORMAL HIGH (ref 0–99)
Triglycerides: 177 mg/dL — ABNORMAL HIGH (ref 0–149)
VLDL Cholesterol Cal: 35 mg/dL (ref 5–40)

## 2019-01-15 NOTE — Telephone Encounter (Signed)
LVMTRC 

## 2019-01-15 NOTE — Telephone Encounter (Signed)
-----   Message from Virginia Crews, MD sent at 01/15/2019  8:16 AM EDT ----- Normal labs. Cholesterol remains high, but not to level of needing a medication at this time.  recommend diet low in saturated fat and regular exercise - 30 min at least 5 times per week

## 2019-01-15 NOTE — Telephone Encounter (Signed)
Patient was advised.  

## 2020-01-13 ENCOUNTER — Other Ambulatory Visit: Payer: Self-pay

## 2020-01-13 ENCOUNTER — Ambulatory Visit (INDEPENDENT_AMBULATORY_CARE_PROVIDER_SITE_OTHER): Payer: Managed Care, Other (non HMO) | Admitting: Family Medicine

## 2020-01-13 ENCOUNTER — Encounter: Payer: Self-pay | Admitting: Family Medicine

## 2020-01-13 VITALS — BP 124/82 | HR 71 | Temp 98.3°F | Ht 68.0 in | Wt 157.0 lb

## 2020-01-13 DIAGNOSIS — Z0000 Encounter for general adult medical examination without abnormal findings: Secondary | ICD-10-CM | POA: Diagnosis not present

## 2020-01-13 DIAGNOSIS — Z1159 Encounter for screening for other viral diseases: Secondary | ICD-10-CM | POA: Diagnosis not present

## 2020-01-13 NOTE — Patient Instructions (Signed)
Preventive Care 19-39 Years Old, Male Preventive care refers to lifestyle choices and visits with your health care provider that can promote health and wellness. This includes:  A yearly physical exam. This is also called an annual well check.  Regular dental and eye exams.  Immunizations.  Screening for certain conditions.  Healthy lifestyle choices, such as eating a healthy diet, getting regular exercise, not using drugs or products that contain nicotine and tobacco, and limiting alcohol use. What can I expect for my preventive care visit? Physical exam Your health care provider will check:  Height and weight. These may be used to calculate body mass index (BMI), which is a measurement that tells if you are at a healthy weight.  Heart rate and blood pressure.  Your skin for abnormal spots. Counseling Your health care provider may ask you questions about:  Alcohol, tobacco, and drug use.  Emotional well-being.  Home and relationship well-being.  Sexual activity.  Eating habits.  Work and work Statistician. What immunizations do I need?  Influenza (flu) vaccine  This is recommended every year. Tetanus, diphtheria, and pertussis (Tdap) vaccine  You may need a Td booster every 10 years. Varicella (chickenpox) vaccine  You may need this vaccine if you have not already been vaccinated. Human papillomavirus (HPV) vaccine  If recommended by your health care provider, you may need three doses over 6 months. Measles, mumps, and rubella (MMR) vaccine  You may need at least one dose of MMR. You may also need a second dose. Meningococcal conjugate (MenACWY) vaccine  One dose is recommended if you are 45-76 years old and a Market researcher living in a residence hall, or if you have one of several medical conditions. You may also need additional booster doses. Pneumococcal conjugate (PCV13) vaccine  You may need this if you have certain conditions and were not  previously vaccinated. Pneumococcal polysaccharide (PPSV23) vaccine  You may need one or two doses if you smoke cigarettes or if you have certain conditions. Hepatitis A vaccine  You may need this if you have certain conditions or if you travel or work in places where you may be exposed to hepatitis A. Hepatitis B vaccine  You may need this if you have certain conditions or if you travel or work in places where you may be exposed to hepatitis B. Haemophilus influenzae type b (Hib) vaccine  You may need this if you have certain risk factors. You may receive vaccines as individual doses or as more than one vaccine together in one shot (combination vaccines). Talk with your health care provider about the risks and benefits of combination vaccines. What tests do I need? Blood tests  Lipid and cholesterol levels. These may be checked every 5 years starting at age 17.  Hepatitis C test.  Hepatitis B test. Screening   Diabetes screening. This is done by checking your blood sugar (glucose) after you have not eaten for a while (fasting).  Sexually transmitted disease (STD) testing. Talk with your health care provider about your test results, treatment options, and if necessary, the need for more tests. Follow these instructions at home: Eating and drinking   Eat a diet that includes fresh fruits and vegetables, whole grains, lean protein, and low-fat dairy products.  Take vitamin and mineral supplements as recommended by your health care provider.  Do not drink alcohol if your health care provider tells you not to drink.  If you drink alcohol: ? Limit how much you have to 0-2  drinks a day. ? Be aware of how much alcohol is in your drink. In the [place] one drink equals one 12 oz bottle of beer (355 mL), one 5 oz glass of wine (148 mL), or one 1 oz glass of hard liquor (44 mL). Lifestyle  Take daily care of your teeth and gums.  Stay active. Exercise for at least 30 minutes on 5 or  more days each week.  Do not use any products that contain nicotine or tobacco, such as cigarettes, e-cigarettes, and chewing tobacco. If you need help quitting, ask your health care provider.  If you are sexually active, practice safe sex. Use a condom or other form of protection to prevent STIs (sexually transmitted infections). What's next?  Go to your health care provider once a year for a well check visit.  Ask your health care provider how often you should have your eyes and teeth checked.  Stay up to date on all vaccines. This information is not intended to replace advice given to you by your health care provider. Make sure you discuss any questions you have with your health care provider. Document Revised: 05/07/2018 Document Reviewed: 05/07/2018 Elsevier Patient Education  2020 [employer].

## 2020-01-13 NOTE — Progress Notes (Signed)
I,Laura E Walsh,acting as a scribe for Shirlee Latch, MD.,have documented all relevant documentation on the behalf of Shirlee Latch, MD,as directed by  Shirlee Latch, MD while in the presence of Shirlee Latch, MD.   Complete physical exam   Patient: Joseph Cohen   DOB: 12-27-1980   39 y.o. Male  MRN: [id number] Visit Date: 01/13/2020  Today's healthcare provider: Shirlee Latch, MD   Chief Complaint  Patient presents with  . Annual Exam   Subjective    Joseph Cohen is a 39 y.o. male who presents today for a complete physical exam.  He reports consuming a general diet. Exercises regularly. He generally feels well. He reports sleeping well. He does not have additional problems to discuss today.  HPI    Past Medical History:  Diagnosis Date  . Zoster without complications    Past Surgical History:  Procedure Laterality Date  . NO PAST SURGERIES     Social History   Socioeconomic History  . Marital status: Single    Spouse name: Not on file  . Number of children: 1  . Years of education: Not on file  . Highest education level: GED or equivalent  Occupational History    Employer: [employer]  Tobacco Use  . Smoking status: Former Smoker    Packs/day: 1.00    Years: 20.00    Pack years: 20.00    Types: Cigarettes    Quit date: 11/11/2017    Years since quitting: 2.1  . Smokeless tobacco: Never Used  Vaping Use  . Vaping Use: Former  Substance and Sexual Activity  . Alcohol use: Not Currently  . Drug use: No  . Sexual activity: Yes    Partners: Female    Birth control/protection: None  Other Topics Concern  . Not on file  Social History Narrative  . Not on file   Social Determinants of Health   Financial Resource Strain:   . Difficulty of Paying Living Expenses: Not on file  Food Insecurity:   . Worried About Programme researcher, broadcasting/film/video in the Last Year: Not on file  . Ran Out of Food in the Last Year: Not on file    Transportation Needs:   . Lack of Transportation (Medical): Not on file  . Lack of Transportation (Non-Medical): Not on file  Physical Activity:   . Days of Exercise per Week: Not on file  . Minutes of Exercise per Session: Not on file  Stress:   . Feeling of Stress : Not on file  Social Connections:   . Frequency of Communication with Friends and Family: Not on file  . Frequency of Social Gatherings with Friends and Family: Not on file  . Attends Religious Services: Not on file  . Active Member of Clubs or Organizations: Not on file  . Attends Banker Meetings: Not on file  . Marital Status: Not on file  Intimate Partner Violence:   . Fear of Current or Ex-Partner: Not on file  . Emotionally Abused: Not on file  . Physically Abused: Not on file  . Sexually Abused: Not on file   Family Status  Relation Name Status  . Mother  Alive  . Father  Alive  . Sister  Alive  . Sister  Alive  . MGM  Deceased  . MGF  Deceased  . PGM  Deceased  . PGF  Deceased  . Neg Hx  (Not Specified)   Family History  Problem Relation Age of  Onset  . Healthy Mother   . Healthy Father   . Healthy Sister   . Healthy Sister   . Dementia Maternal Grandmother   . Breast cancer Maternal Grandmother   . Dementia Maternal Grandfather   . Dementia Paternal Grandmother   . Dementia Paternal Grandfather   . Colon cancer Neg Hx   . Prostate cancer Neg Hx    No Known Allergies  Patient Care Team: Erasmo Downer, MD as PCP - General (Family Medicine)   Medications: Outpatient Medications Prior to Visit  Medication Sig  . nicotine (NICODERM CQ - DOSED IN MG/24 HOURS) 14 mg/24hr patch Place 14 mg onto the skin daily.   No facility-administered medications prior to visit.    Review of Systems  Constitutional: Negative.   HENT: Negative.   Eyes: Negative.   Respiratory: Negative.   Cardiovascular: Negative.   Gastrointestinal: Negative.   Endocrine: Negative.    Genitourinary: Negative.   Musculoskeletal: Negative.   Skin: Negative.   Allergic/Immunologic: Negative.   Neurological: Negative.   Hematological: Negative.   Psychiatric/Behavioral: Negative.     Last CBC Lab Results  Component Value Date   WBC 12.8 (H) 05/30/2017   HGB 17.2 05/30/2017   HCT 51.5 05/30/2017   MCV 94.8 05/30/2017   MCH 31.7 05/30/2017   RDW 12.3 05/30/2017   PLT 321 05/30/2017   Last metabolic panel Lab Results  Component Value Date   GLUCOSE 93 01/14/2019   NA 142 01/14/2019   K 4.7 01/14/2019   CL 105 01/14/2019   CO2 22 01/14/2019   BUN 12 01/14/2019   CREATININE 1.07 01/14/2019   GFRNONAA 88 01/14/2019   GFRAA 101 01/14/2019   CALCIUM 8.9 01/14/2019   PROT 6.5 01/14/2019   ALBUMIN 4.6 01/14/2019   LABGLOB 1.9 01/14/2019   AGRATIO 2.4 (H) 01/14/2019   BILITOT 0.4 01/14/2019   ALKPHOS 101 01/14/2019   AST 21 01/14/2019   ALT 37 01/14/2019   ANIONGAP 9 05/30/2017   Last lipids Lab Results  Component Value Date   CHOL 226 (H) 01/14/2019   HDL 48 01/14/2019   LDLCALC 143 (H) 01/14/2019   TRIG 177 (H) 01/14/2019   CHOLHDL 4.7 01/14/2019   Last hemoglobin A1c No results found for: HGBA1C Last thyroid functions No results found for: TSH, T3TOTAL, T4TOTAL, THYROIDAB  Objective    BP 124/82 (BP Location: Left Arm, Patient Position: Sitting, Cuff Size: Large)   Pulse 71   Temp 98.3 F (36.8 C) (Oral)   Ht 5\' 8"  (1.727 m)   Wt 157 lb (71.2 kg)   SpO2 97%   BMI 23.87 kg/m    Physical Exam Constitutional:      Appearance: Normal appearance.  HENT:     Head: Normocephalic and atraumatic.     Right Ear: Tympanic membrane, ear canal and external ear normal.     Left Ear: Tympanic membrane, ear canal and external ear normal.  Eyes:     Extraocular Movements: Extraocular movements intact.     Conjunctiva/sclera: Conjunctivae normal.     Pupils: Pupils are equal, round, and reactive to light.  Cardiovascular:     Rate and Rhythm:  Normal rate and regular rhythm.     Pulses: Normal pulses.     Heart sounds: Normal heart sounds.  Pulmonary:     Effort: Pulmonary effort is normal.     Breath sounds: Normal breath sounds.  Abdominal:     Palpations: Abdomen is soft.  Tenderness: There is no abdominal tenderness.  Musculoskeletal:     Cervical back: Neck supple. No tenderness.     Right lower leg: No edema.     Left lower leg: No edema.  Lymphadenopathy:     Cervical: No cervical adenopathy.  Skin:    General: Skin is warm and dry.  Neurological:     Mental Status: He is alert and oriented to person, place, and time. Mental status is at baseline.  Psychiatric:        Mood and Affect: Mood normal.        Behavior: Behavior normal.        Thought Content: Thought content normal.        Judgment: Judgment normal.      Last depression screening scores PHQ 2/9 Scores 01/13/2020 01/13/2019 12/11/2017  PHQ - 2 Score 0 0 0  PHQ- 9 Score 0 0 -   Last fall risk screening Fall Risk  01/13/2020  Falls in the past year? 0  Number falls in past yr: 0  Injury with Fall? 0  Follow up Falls evaluation completed   Last Audit-C alcohol use screening Alcohol Use Disorder Test (AUDIT) 01/13/2020  1. How often do you have a drink containing alcohol? 0  2. How many drinks containing alcohol do you have on a typical day when you are drinking? 0  3. How often do you have six or more drinks on one occasion? 0  AUDIT-C Score 0  Alcohol Brief Interventions/Follow-up AUDIT Score <7 follow-up not indicated   A score of 3 or more in women, and 4 or more in men indicates increased risk for alcohol abuse, EXCEPT if all of the points are from question 1   No results found for any visits on 01/13/20.  Assessment & Plan    Routine Health Maintenance and Physical Exam  Exercise Activities and Dietary recommendations Goals   None     Immunization History  Administered Date(s) Administered  . Moderna SARS-COVID-2 Vaccination  09/02/2019, 09/30/2019  . Tdap 12/11/2017    Health Maintenance  Topic Date Due  . Hepatitis C Screening  Never done  . INFLUENZA VACCINE  12/26/2019  . TETANUS/TDAP  12/12/2027  . COVID-19 Vaccine  Completed  . HIV Screening  Completed    Discussed health benefits of physical activity, and encouraged him to engage in regular exercise appropriate for his age and condition.  Problem List Items Addressed This Visit    None    Visit Diagnoses    Annual physical exam    -  Primary   Relevant Orders   Comprehensive metabolic panel   Lipid panel   Need for hepatitis C screening test       Relevant Orders   Hepatitis C Antibody       Return in about 1 year (around 01/12/2021) for CPE.     I, Shirlee Latch, MD, have reviewed all documentation for this visit. The documentation on 01/13/20 for the exam, diagnosis, procedures, and orders are all accurate and complete.   Aeric Burnham, Marzella Schlein, MD, MPH [facility]

## 2020-01-14 ENCOUNTER — Telehealth: Payer: Self-pay

## 2020-01-14 LAB — COMPREHENSIVE METABOLIC PANEL
ALT: 18 IU/L (ref 0–44)
AST: 17 IU/L (ref 0–40)
Albumin/Globulin Ratio: 1.9 (ref 1.2–2.2)
Albumin: 4.5 g/dL (ref 4.0–5.0)
Alkaline Phosphatase: 121 IU/L (ref 48–121)
BUN/Creatinine Ratio: 14 (ref 9–20)
BUN: 11 mg/dL (ref 6–20)
Bilirubin Total: 0.4 mg/dL (ref 0.0–1.2)
CO2: 20 mmol/L (ref 20–29)
Calcium: 9.2 mg/dL (ref 8.7–10.2)
Chloride: 102 mmol/L (ref 96–106)
Creatinine, Ser: 0.81 mg/dL (ref 0.76–1.27)
GFR calc Af Amer: 129 mL/min/{1.73_m2} (ref >59)
GFR calc non Af Amer: 112 mL/min/{1.73_m2} (ref >59)
Globulin, Total: 2.4 g/dL (ref 1.5–4.5)
Glucose: 87 mg/dL (ref 65–99)
Potassium: 4.3 mmol/L (ref 3.5–5.2)
Sodium: 139 mmol/L (ref 134–144)
Total Protein: 6.9 g/dL (ref 6.0–8.5)

## 2020-01-14 LAB — LIPID PANEL
Chol/HDL Ratio: 5.5 ratio — ABNORMAL HIGH (ref 0.0–5.0)
Cholesterol, Total: 237 mg/dL — ABNORMAL HIGH (ref 100–199)
HDL: 43 mg/dL (ref >39)
LDL Chol Calc (NIH): 148 mg/dL — ABNORMAL HIGH (ref 0–99)
Triglycerides: 253 mg/dL — ABNORMAL HIGH (ref 0–149)
VLDL Cholesterol Cal: 46 mg/dL — ABNORMAL HIGH (ref 5–40)

## 2020-01-14 LAB — HEPATITIS C ANTIBODY: Hep C Virus Ab: <0.1 s/co ratio (ref 0.0–0.9)

## 2020-01-14 NOTE — Telephone Encounter (Signed)
Seen by patient Zigmund Gottron on 01/14/2020 10:59 AM

## 2020-01-14 NOTE — Telephone Encounter (Signed)
-----   Message from Erasmo Downer, MD sent at 01/14/2020  8:18 AM EDT ----- Normal labs. Cholesterol remains somewhat elevated, but heart disease risk remains low.  I recommend diet low in saturated fat and regular exercise - 30 min at least 5 times per week.

## 2020-12-29 ENCOUNTER — Emergency Department: Payer: Managed Care, Other (non HMO)

## 2020-12-29 ENCOUNTER — Other Ambulatory Visit: Payer: Self-pay

## 2020-12-29 ENCOUNTER — Emergency Department
Admission: EM | Admit: 2020-12-29 | Discharge: 2020-12-29 | Disposition: A | Discharge status: Home / Self Care | Payer: Managed Care, Other (non HMO) | Attending: Emergency Medicine | Admitting: Emergency Medicine

## 2020-12-29 ENCOUNTER — Encounter: Payer: Self-pay | Admitting: Emergency Medicine

## 2020-12-29 DIAGNOSIS — R0789 Other chest pain: Secondary | ICD-10-CM | POA: Diagnosis present

## 2020-12-29 DIAGNOSIS — Z87891 Personal history of nicotine dependence: Secondary | ICD-10-CM | POA: Diagnosis not present

## 2020-12-29 LAB — BASIC METABOLIC PANEL WITH GFR
Anion gap: 10 (ref 5–15)
BUN: 10 mg/dL (ref 6–20)
CO2: 25 mmol/L (ref 22–32)
Calcium: 9 mg/dL (ref 8.9–10.3)
Chloride: 106 mmol/L (ref 98–111)
Creatinine, Ser: 0.92 mg/dL (ref 0.61–1.24)
GFR, Estimated: >60 mL/min (ref >60)
Glucose, Bld: 142 mg/dL — ABNORMAL HIGH (ref 70–99)
Potassium: 3.5 mmol/L (ref 3.5–5.1)
Sodium: 141 mmol/L (ref 135–145)

## 2020-12-29 LAB — CBC
HCT: 46.1 % (ref 39.0–52.0)
Hemoglobin: 15.9 g/dL (ref 13.0–17.0)
MCH: 32.1 pg (ref 26.0–34.0)
MCHC: 34.5 g/dL (ref 30.0–36.0)
MCV: 92.9 fL (ref 80.0–100.0)
Platelets: 295 K/uL (ref 150–400)
RBC: 4.96 MIL/uL (ref 4.22–5.81)
RDW: 12.2 % (ref 11.5–15.5)
WBC: 6 K/uL (ref 4.0–10.5)
nRBC: 0 % (ref 0.0–0.2)

## 2020-12-29 LAB — TROPONIN I (HIGH SENSITIVITY): Troponin I (High Sensitivity): 3 ng/L (ref <18)

## 2020-12-29 MED ORDER — METHOCARBAMOL 500 MG PO TABS: 500.0000 mg | ORAL_TABLET | Freq: Three times a day (TID) | ORAL | 0 refills | Status: DC | PRN

## 2020-12-29 MED ORDER — LOPERAMIDE HCL 2 MG PO TABS: 2.0000 mg | ORAL_TABLET | Freq: Four times a day (QID) | ORAL | 0 refills | Status: DC | PRN

## 2020-12-29 MED ORDER — ONDANSETRON 4 MG PO TBDP: 4.0000 mg | ORAL_TABLET | Freq: Three times a day (TID) | ORAL | 0 refills | Status: DC | PRN

## 2020-12-29 NOTE — ED Notes (Signed)
See triage note  Presents with 1-1 1/2 weeks of intermittent chest discomfort  Denies any fever or cough  Has had some nausea

## 2020-12-29 NOTE — ED Triage Notes (Signed)
C/O left sided chest discomfort intermittently x 1 week.  States belching relieves discomfort.  AAOx3.  Skin warm and dry. NAD

## 2020-12-29 NOTE — ED Provider Notes (Signed)
[facility] Emergency Department Provider Note   ____________________________________________    I have reviewed the triage vital signs and the nursing notes.   HISTORY  Chief Complaint Chest Pain     HPI Joseph Cohen is a 40 y.o. male who presents with intermittent chest discomfort over the last week.  Patient reports when he burps it seems to get better, he does not have any pain currently.  He is concerned that this is related to use of kratom because he thinks it is "messed up his digestion ".  Denies shortness of breath.  No pleurisy.  No fevers chills or cough.  No shortness of breath.  Does not smoke cigarettes.  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 Authorizing Provider  loperamide (IMODIUM A-D) 2 MG tablet Take 1 tablet (2 mg total) by mouth 4 (four) times daily as needed for diarrhea or loose stools. 12/29/20  Yes Jene Every, MD  methocarbamol (ROBAXIN) 500 MG tablet Take 1 tablet (500 mg total) by mouth every 8 (eight) hours as needed for muscle spasms. 12/29/20  Yes Jene Every, MD  ondansetron (ZOFRAN ODT) 4 MG disintegrating tablet Take 1 tablet (4 mg total) by mouth every 8 (eight) hours as needed. 12/29/20  Yes Jene Every, MD     Allergies Patient has no known allergies.  Family History  Problem Relation Age of Onset   Healthy Mother    Healthy Father    Healthy Sister    Healthy Sister    Dementia Maternal Grandmother    Breast cancer Maternal Grandmother    Dementia Maternal Grandfather    Dementia Paternal Grandmother    Dementia Paternal Grandfather     Colon cancer Neg Hx    Prostate cancer Neg Hx     Social History Social History   Tobacco Use   Smoking status: Former    Packs/day: 1.00    Years: 20.00    Pack years: 20.00    Types: Cigarettes    Quit date: 11/11/2017    Years since quitting: 3.1   Smokeless tobacco: Never  Vaping Use   Vaping Use: Former  Substance Use Topics   Alcohol use: Not Currently   Drug use: No    Review of Systems  Constitutional: No fever/chills Eyes: No visual changes.  ENT: No sore throat. Cardiovascular: As above, aching discomfort in epigastrium intermittently Respiratory: Denies shortness of breath. Gastrointestinal: No abdominal pain.  No nausea, no vomiting.   Genitourinary: Negative for dysuria. Musculoskeletal: Negative for back pain. Skin: Negative for rash. Neurological: Negative for headaches or weakness   ____________________________________________   PHYSICAL EXAM:  VITAL SIGNS: ED Triage Vitals  Enc Vitals Group     BP 12/29/20 0805 (!) 127/91     Pulse Rate 12/29/20 0805 78     Resp 12/29/20 0805 16     Temp 12/29/20 0805 98 F (36.7 C)     Temp Source 12/29/20 0805 Oral     SpO2 12/29/20 0805 100 %     Weight 12/29/20 0800 71.2 kg (156 lb 15.5 oz)  Height 12/29/20 0800 1.727 m (5\' 8" )     Head Circumference --      Peak Flow --      Pain Score 12/29/20 0800 0     Pain Loc --      Pain Edu? --      Excl. in GC? --     Constitutional: Alert and oriented. No acute distress. Pleasant and interactive  Nose: No congestion/rhinnorhea. Mouth/Throat: Mucous membranes are moist.   Neck:  Painless ROM Cardiovascular: Normal rate, regular rhythm. Grossly normal heart sounds.  Good peripheral circulation. Respiratory: Normal respiratory effort.  No retractions. Lungs CTAB. Gastrointestinal: Soft and nontender. No distention.    Musculoskeletal: No lower extremity tenderness nor edema.  Warm and well perfused Neurologic:  Normal speech and language. No gross  focal neurologic deficits are appreciated.  Skin:  Skin is warm, dry and intact. No rash noted. Psychiatric: Mood and affect are normal. Speech and behavior are normal.  ____________________________________________   LABS (all labs ordered are listed, but only abnormal results are displayed)  Labs Reviewed  BASIC METABOLIC PANEL - Abnormal; Notable for the following components:      Result Value   Glucose, Bld 142 (*)    All other components within normal limits  CBC  TROPONIN I (HIGH SENSITIVITY)   ____________________________________________  EKG  ED ECG REPORT I, 02/28/21, the attending physician, personally viewed and interpreted this ECG.  Date: 12/29/2020  Rhythm: normal sinus rhythm QRS Axis: normal Intervals: normal ST/T Wave abnormalities: normal Narrative Interpretation: no evidence of acute ischemia  ____________________________________________  RADIOLOGY  Chest x-ray reviewed by me, no acute abnormality ____________________________________________   PROCEDURES  Procedure(s) performed: No  Procedures   Critical Care performed: No ____________________________________________   INITIAL IMPRESSION / ASSESSMENT AND PLAN / ED COURSE  Pertinent labs & imaging results that were available during my care of the patient were reviewed by me and considered in my medical decision making (see chart for details).   Patient well-appearing in no acute distress, asymptomatic here in the emergency department, EKG is normal, high sensitive troponin is normal, chest x-ray is clear.  Not consistent with ACS, do suspect gastritis/esophagitis/GERD  Lab work overall reassuring, normal white blood cell count, normal BMP.  Discussed ways to taper off of kratom usage with patient, will provide Rx for symptom relief    ____________________________________________   FINAL CLINICAL IMPRESSION(S) / ED DIAGNOSES  Final diagnoses:  Atypical chest pain         Note:  This document was prepared using Dragon voice recognition software and may include unintentional dictation errors.    02/28/2021, MD 12/29/20 1357

## 2021-01-12 NOTE — Progress Notes (Signed)
Complete physical exam   Patient: Joseph Cohen   DOB: 09/20/1980   40 y.o. Male  MRN: [id number] Visit Date: 01/15/2021  Today's healthcare provider: Shirlee Latch, MD   Chief Complaint  Patient presents with   Annual Exam   Subjective    Joseph Cohen is a 40 y.o. male who presents today for a complete physical exam.  He reports consuming a general diet. The patient does not participate in regular exercise at present. He generally feels well. He reports sleeping well. He does not have additional problems to discuss today.  HPI   Has been using Kratom for aches and pains. 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 He was given imodium, robaxin, zofran to use prn for withdrawal symptoms after being seen at ER 8/5.  He is tapering back slowly now. He is starting to feel better already. No more CP after ER visit. Had cardiac causes ruled out.  Past Medical History:  Diagnosis Date   Zoster without complications    Past Surgical History:  Procedure Laterality Date   NO PAST SURGERIES     Social History   Socioeconomic History   Marital status: Married    Spouse name: Not on file   Number of children: 1   Years of education: Not on file   Highest education level: GED or equivalent  Occupational History    Employer: [employer]  Tobacco Use   Smoking status: Former    Packs/day: 1.00    Years: 20.00    Pack years: 20.00    Types: Cigarettes    Quit date: 11/11/2017    Years since quitting: 3.1   Smokeless tobacco: Never  Vaping Use   Vaping Use: Former  Substance and Sexual Activity   Alcohol use: Not Currently   Drug use: No   Sexual activity: Yes    Partners: Female    Birth control/protection: None  Other Topics Concern   Not on file  Social History Narrative   Not on file   Social Determinants of Health   Financial Resource Strain: Not on file  Food Insecurity: Not on file   Transportation Needs: Not on file  Physical Activity: Not on file  Stress: Not on file  Social Connections: Not on file  Intimate Partner Violence: Not on file   Family Status  Relation Name Status   Mother  Alive   Father  Alive   Sister  Alive   Sister  Alive   MGM  Deceased   MGF  Deceased   PGM  Deceased   PGF  Deceased   Neg Hx  (Not Specified)   Family History  Problem Relation Age of Onset   Healthy Mother    Healthy Father    Healthy Sister    Healthy Sister    Dementia Maternal Grandmother    Breast cancer Maternal Grandmother    Dementia Maternal Grandfather    Dementia Paternal Grandmother    Dementia Paternal Grandfather    Colon cancer Neg Hx    Prostate cancer Neg Hx    No Known Allergies  Patient Care Team: Erasmo Downer, MD as PCP - General (Family Medicine)   Medications: Outpatient Medications Prior to Visit  Medication Sig   loperamide (IMODIUM A-D) 2 MG tablet Take 1 tablet (2 mg total) by mouth 4 (four) times daily as needed for diarrhea or loose stools.   methocarbamol (ROBAXIN) 500  MG tablet Take 1 tablet (500 mg total) by mouth every 8 (eight) hours as needed for muscle spasms.   ondansetron (ZOFRAN ODT) 4 MG disintegrating tablet Take 1 tablet (4 mg total) by mouth every 8 (eight) hours as needed.   No facility-administered medications prior to visit.    Review of Systems  All other systems reviewed and are negative.  Last CBC Lab Results  Component Value Date   WBC 6.0 12/29/2020   HGB 15.9 12/29/2020   HCT 46.1 12/29/2020   MCV 92.9 12/29/2020   MCH 32.1 12/29/2020   RDW 12.2 12/29/2020   PLT 295 12/29/2020   Last metabolic panel Lab Results  Component Value Date   GLUCOSE 142 (H) 12/29/2020   NA 141 12/29/2020   K 3.5 12/29/2020   CL 106 12/29/2020   CO2 25 12/29/2020   BUN 10 12/29/2020   CREATININE 0.92 12/29/2020   GFRNONAA >60 12/29/2020   GFRAA 129 01/13/2020   CALCIUM 9.0 12/29/2020   PROT 6.9  01/13/2020   ALBUMIN 4.5 01/13/2020   LABGLOB 2.4 01/13/2020   AGRATIO 1.9 01/13/2020   BILITOT 0.4 01/13/2020   ALKPHOS 121 01/13/2020   AST 17 01/13/2020   ALT 18 01/13/2020   ANIONGAP 10 12/29/2020   Last lipids Lab Results  Component Value Date   CHOL 237 (H) 01/13/2020   HDL 43 01/13/2020   LDLCALC 148 (H) 01/13/2020   TRIG 253 (H) 01/13/2020   CHOLHDL 5.5 (H) 01/13/2020      Objective    BP 107/78 (BP Location: Left Arm, Patient Position: Sitting, Cuff Size: Large)   Pulse 60   Temp 98.2 F (36.8 C) (Oral)   Resp 16   Ht 5\' 7"  (1.702 m)   Wt 152 lb 3.2 oz (69 kg)   BMI 23.84 kg/m  BP Readings from Last 3 Encounters:  01/15/21 107/78  12/29/20 108/78  01/13/20 124/82   Wt Readings from Last 3 Encounters:  01/15/21 152 lb 3.2 oz (69 kg)  12/29/20 156 lb 15.5 oz (71.2 kg)  01/13/20 157 lb (71.2 kg)      Physical Exam Vitals reviewed.  Constitutional:      General: He is not in acute distress.    Appearance: Normal appearance. He is well-developed. He is not diaphoretic.  HENT:     Head: Normocephalic and atraumatic.     Right Ear: Tympanic membrane, ear canal and external ear normal.     Left Ear: Tympanic membrane, ear canal and external ear normal.     Nose: Nose normal.     Mouth/Throat:     Mouth: Mucous membranes are moist.     Pharynx: Oropharynx is clear. No oropharyngeal exudate.  Eyes:     General: No scleral icterus.    Conjunctiva/sclera: Conjunctivae normal.     Pupils: Pupils are equal, round, and reactive to light.  Neck:     Thyroid: No thyromegaly.  Cardiovascular:     Rate and Rhythm: Normal rate and regular rhythm.     Pulses: Normal pulses.     Heart sounds: Normal heart sounds. No murmur heard. Pulmonary:     Effort: Pulmonary effort is normal. No respiratory distress.     Breath sounds: Normal breath sounds. No wheezing or rales.  Abdominal:     General: There is no distension.     Palpations: Abdomen is soft.      Tenderness: There is no abdominal tenderness.  Musculoskeletal:  General: No deformity.     Cervical back: Neck supple.     Right lower leg: No edema.     Left lower leg: No edema.  Lymphadenopathy:     Cervical: No cervical adenopathy.  Skin:    General: Skin is warm and dry.     Findings: No rash.  Neurological:     Mental Status: He is alert and oriented to person, place, and time. Mental status is at baseline.     Sensory: No sensory deficit.     Motor: No weakness.     Gait: Gait normal.  Psychiatric:        Mood and Affect: Mood normal.        Behavior: Behavior normal.        Thought Content: Thought content normal.     Last depression screening scores PHQ 2/9 Scores 01/15/2021 01/13/2020 01/13/2019  PHQ - 2 Score 0 0 0  PHQ- 9 Score 0 0 0   Last fall risk screening Fall Risk  01/15/2021  Falls in the past year? 0  Number falls in past yr: 0  Injury with Fall? 0  Risk for fall due to : No Fall Risks  Follow up Falls evaluation completed   Last Audit-C alcohol use screening Alcohol Use Disorder Test (AUDIT) 01/15/2021  1. How often do you have a drink containing alcohol? 0  2. How many drinks containing alcohol do you have on a typical day when you are drinking? 0  3. How often do you have six or more drinks on one occasion? 0  AUDIT-C Score 0  Alcohol Brief Interventions/Follow-up -   A score of 3 or more in women, and 4 or more in men indicates increased risk for alcohol abuse, EXCEPT if all of the points are from question 1   No results found for any visits on 01/15/21.  Assessment & Plan    Routine Health Maintenance and Physical Exam  Exercise Activities and Dietary recommendations  Goals   None     Immunization History  Administered Date(s) Administered   Moderna Sars-Covid-2 Vaccination 09/02/2019, 09/30/2019, 05/05/2020   Tdap 12/11/2017    Health Maintenance  Topic Date Due   Pneumococcal Vaccine 20-87 Years old (1 - PCV) Never done    INFLUENZA VACCINE  12/25/2020   TETANUS/TDAP  12/12/2027   COVID-19 Vaccine  Completed   Hepatitis C Screening  Completed   HIV Screening  Completed   HPV VACCINES  Aged Out    Discussed health benefits of physical activity, and encouraged him to engage in regular exercise appropriate for his age and condition.  Problem List Items Addressed This Visit   None Visit Diagnoses     Annual physical exam    -  Primary   Relevant Orders   Lipid Panel With LDL/HDL Ratio   Hepatic function panel   Hemoglobin A1c   Hyperglycemia       Relevant Orders   Lipid Panel With LDL/HDL Ratio   Hepatic function panel   Hemoglobin A1c        Return in about 1 year (around 01/15/2022) for CPE.     I, Shirlee Latch, MD, have reviewed all documentation for this visit. The documentation on 01/15/21 for the exam, diagnosis, procedures, and orders are all accurate and complete.   Joseph Cohen, Marzella Schlein, MD, MPH [facility]

## 2021-01-15 ENCOUNTER — Other Ambulatory Visit: Payer: Self-pay

## 2021-01-15 ENCOUNTER — Encounter: Payer: Self-pay | Admitting: Family Medicine

## 2021-01-15 ENCOUNTER — Ambulatory Visit (INDEPENDENT_AMBULATORY_CARE_PROVIDER_SITE_OTHER): Payer: Managed Care, Other (non HMO) | Admitting: Family Medicine

## 2021-01-15 VITALS — BP 107/78 | HR 60 | Temp 98.2°F | Resp 16 | Ht 67.0 in | Wt 152.2 lb

## 2021-01-15 DIAGNOSIS — Z0000 Encounter for general adult medical examination without abnormal findings: Secondary | ICD-10-CM

## 2021-01-15 DIAGNOSIS — R739 Hyperglycemia, unspecified: Secondary | ICD-10-CM | POA: Diagnosis not present

## 2021-01-16 LAB — LIPID PANEL WITH LDL/HDL RATIO
Cholesterol, Total: 223 mg/dL — ABNORMAL HIGH (ref 100–199)
HDL: 48 mg/dL (ref >39)
LDL Chol Calc (NIH): 144 mg/dL — ABNORMAL HIGH (ref 0–99)
LDL/HDL Ratio: 3 ratio (ref 0.0–3.6)
Triglycerides: 173 mg/dL — ABNORMAL HIGH (ref 0–149)
VLDL Cholesterol Cal: 31 mg/dL (ref 5–40)

## 2021-01-16 LAB — HEPATIC FUNCTION PANEL
ALT: 21 IU/L (ref 0–44)
AST: 15 IU/L (ref 0–40)
Albumin: 4.8 g/dL (ref 4.0–5.0)
Alkaline Phosphatase: 115 IU/L (ref 44–121)
Bilirubin Total: 0.4 mg/dL (ref 0.0–1.2)
Bilirubin, Direct: 0.11 mg/dL (ref 0.00–0.40)
Total Protein: 6.7 g/dL (ref 6.0–8.5)

## 2021-01-16 LAB — HEMOGLOBIN A1C
Est. average glucose Bld gHb Est-mCnc: 100 mg/dL
Hgb A1c MFr Bld: 5.1 % (ref 4.8–5.6)

## 2021-08-08 IMAGING — CR DG CHEST 2V
2 series · 2 of 2 positions shown · non-contrast
Comparison: None.

CLINICAL DATA: Chest pain

EXAM:
CHEST - 2 VIEW

[chest pa]
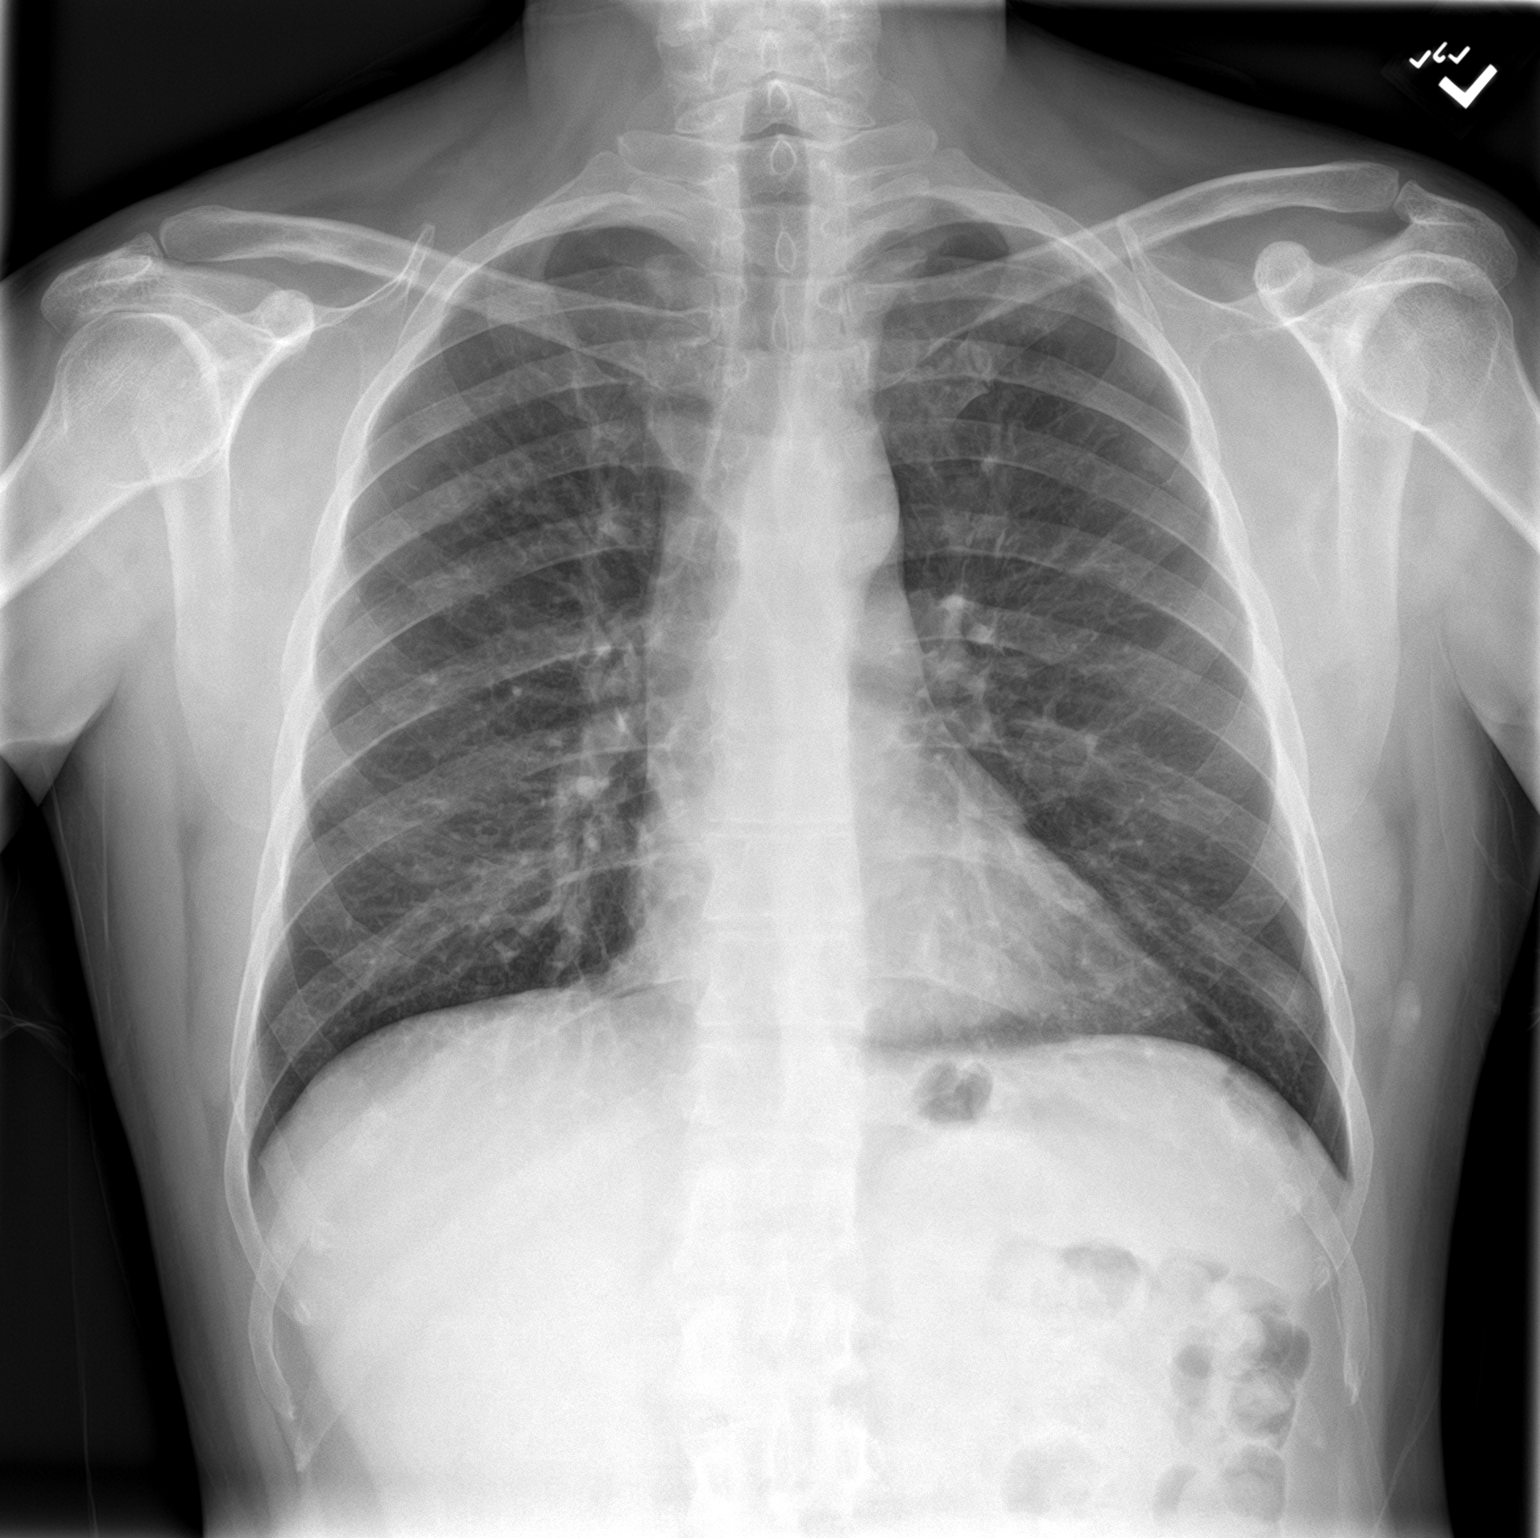

[chest lat]
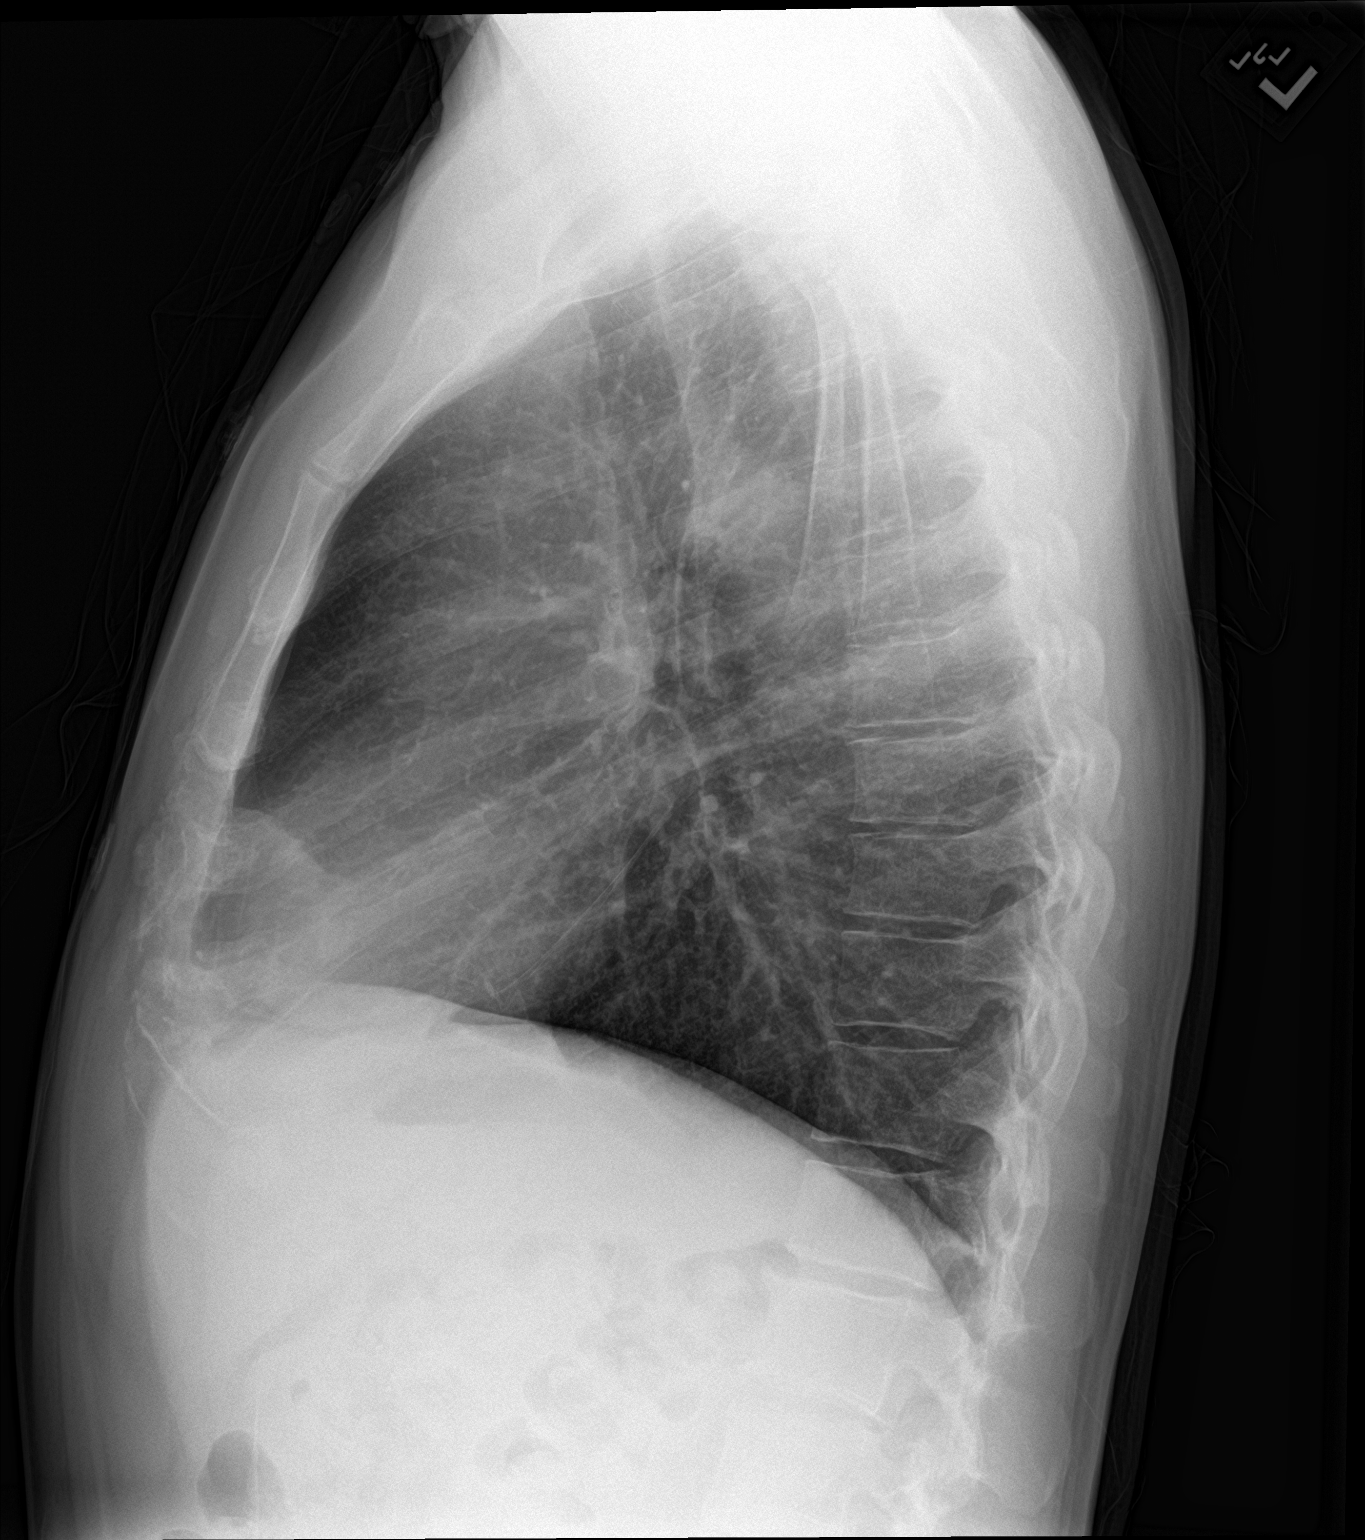

[2 of 2 positions shown; findings below may reference images not displayed]

FINDINGS: Normal cardiac and mediastinal contours.

Nodular opacity of the left costophrenic angle, possibly a nipple
shadow. Recommend repeat radiographs with nipple markers. Lungs
otherwise clear.

No pleural abnormality.
IMPRESSION: Nodular opacity of the left costophrenic angle, possibly a nipple
shadow. Recommend repeat radiographs with nipple markers.

## 2022-01-17 ENCOUNTER — Encounter: Payer: Managed Care, Other (non HMO) | Admitting: Family Medicine

## 2022-12-07 ENCOUNTER — Inpatient Hospital Stay
Admission: EM | Admit: 2022-12-07 | Discharge: 2022-12-10 | DRG: 871 | Disposition: A | Discharge status: Home / Self Care | Payer: No Typology Code available for payment source | Attending: Internal Medicine | Admitting: Internal Medicine

## 2022-12-07 ENCOUNTER — Other Ambulatory Visit: Payer: Self-pay

## 2022-12-07 ENCOUNTER — Emergency Department: Payer: No Typology Code available for payment source

## 2022-12-07 DIAGNOSIS — D75839 Thrombocytosis, unspecified: Secondary | ICD-10-CM | POA: Diagnosis present

## 2022-12-07 DIAGNOSIS — R6520 Severe sepsis without septic shock: Secondary | ICD-10-CM | POA: Diagnosis present

## 2022-12-07 DIAGNOSIS — Z820 Family history of epilepsy and other diseases of the nervous system: Secondary | ICD-10-CM | POA: Diagnosis not present

## 2022-12-07 DIAGNOSIS — Z87891 Personal history of nicotine dependence: Secondary | ICD-10-CM | POA: Diagnosis not present

## 2022-12-07 DIAGNOSIS — A419 Sepsis, unspecified organism: Secondary | ICD-10-CM

## 2022-12-07 DIAGNOSIS — J984 Other disorders of lung: Principal | ICD-10-CM

## 2022-12-07 DIAGNOSIS — J9601 Acute respiratory failure with hypoxia: Secondary | ICD-10-CM | POA: Diagnosis present

## 2022-12-07 DIAGNOSIS — R0609 Other forms of dyspnea: Secondary | ICD-10-CM | POA: Diagnosis not present

## 2022-12-07 DIAGNOSIS — Z1152 Encounter for screening for COVID-19: Secondary | ICD-10-CM

## 2022-12-07 DIAGNOSIS — F1410 Cocaine abuse, uncomplicated: Secondary | ICD-10-CM | POA: Diagnosis present

## 2022-12-07 DIAGNOSIS — J189 Pneumonia, unspecified organism: Secondary | ICD-10-CM | POA: Diagnosis present

## 2022-12-07 DIAGNOSIS — Z803 Family history of malignant neoplasm of breast: Secondary | ICD-10-CM | POA: Diagnosis not present

## 2022-12-07 DIAGNOSIS — Z720 Tobacco use: Secondary | ICD-10-CM | POA: Insufficient documentation

## 2022-12-07 DIAGNOSIS — Z79899 Other long term (current) drug therapy: Secondary | ICD-10-CM

## 2022-12-07 HISTORY — DX: Acute respiratory failure with hypoxia: J96.01

## 2022-12-07 HISTORY — DX: Sepsis, unspecified organism: A41.9

## 2022-12-07 LAB — COMPREHENSIVE METABOLIC PANEL
ALT: 42 U/L (ref 0–44)
AST: 21 U/L (ref 15–41)
Albumin: 4.1 g/dL (ref 3.5–5.0)
Alkaline Phosphatase: 86 U/L (ref 38–126)
Anion gap: 11 (ref 5–15)
BUN: 9 mg/dL (ref 6–20)
CO2: 23 mmol/L (ref 22–32)
Calcium: 8.8 mg/dL — ABNORMAL LOW (ref 8.9–10.3)
Chloride: 103 mmol/L (ref 98–111)
Creatinine, Ser: 0.89 mg/dL (ref 0.61–1.24)
GFR, Estimated: >60 mL/min (ref >60)
Glucose, Bld: 100 mg/dL — ABNORMAL HIGH (ref 70–99)
Potassium: 3.8 mmol/L (ref 3.5–5.1)
Sodium: 137 mmol/L (ref 135–145)
Total Bilirubin: 0.7 mg/dL (ref 0.3–1.2)
Total Protein: 8 g/dL (ref 6.5–8.1)

## 2022-12-07 LAB — CBC WITH DIFFERENTIAL/PLATELET
Abs Immature Granulocytes: 0.05 10*3/uL (ref 0.00–0.07)
Basophils Absolute: 0.1 10*3/uL (ref 0.0–0.1)
Basophils Relative: 0 %
Eosinophils Absolute: 0.1 10*3/uL (ref 0.0–0.5)
Eosinophils Relative: 1 %
HCT: 49.8 % (ref 39.0–52.0)
Hemoglobin: 17.3 g/dL — ABNORMAL HIGH (ref 13.0–17.0)
Immature Granulocytes: 0 %
Lymphocytes Relative: 8 %
Lymphs Abs: 1.1 10*3/uL (ref 0.7–4.0)
MCH: 32 pg (ref 26.0–34.0)
MCHC: 34.7 g/dL (ref 30.0–36.0)
MCV: 92.2 fL (ref 80.0–100.0)
Monocytes Absolute: 0.6 10*3/uL (ref 0.1–1.0)
Monocytes Relative: 4 %
Neutro Abs: 12 10*3/uL — ABNORMAL HIGH (ref 1.7–7.7)
Neutrophils Relative %: 87 %
Platelets: 482 10*3/uL — ABNORMAL HIGH (ref 150–400)
RBC: 5.4 MIL/uL (ref 4.22–5.81)
RDW: 11.8 % (ref 11.5–15.5)
WBC: 14 10*3/uL — ABNORMAL HIGH (ref 4.0–10.5)
nRBC: 0 % (ref 0.0–0.2)

## 2022-12-07 LAB — TROPONIN I (HIGH SENSITIVITY)
Troponin I (High Sensitivity): 4 ng/L
Troponin I (High Sensitivity): 4 ng/L (ref <18)

## 2022-12-07 LAB — LACTIC ACID, PLASMA
Lactic Acid, Venous: 1.5 mmol/L (ref 0.5–1.9)
Lactic Acid, Venous: 2.2 mmol/L (ref 0.5–1.9)

## 2022-12-07 LAB — RESP PANEL BY RT-PCR (RSV, FLU A&B, COVID)  RVPGX2
Influenza A by PCR: NEGATIVE
Influenza B by PCR: NEGATIVE
Resp Syncytial Virus by PCR: NEGATIVE
SARS Coronavirus 2 by RT PCR: NEGATIVE

## 2022-12-07 LAB — CK: Total CK: 54 U/L (ref 49–397)

## 2022-12-07 MED ORDER — SODIUM CHLORIDE 0.9 % IV SOLN
2.0000 g | INTRAVENOUS | Status: DC
  Administered 2022-12-07: 2 g via INTRAVENOUS
  Filled 2022-12-07: qty 20

## 2022-12-07 MED ORDER — LACTATED RINGERS IV SOLN
150.0000 mL/h | INTRAVENOUS | Status: DC
  Administered 2022-12-07: 150 mL/h via INTRAVENOUS

## 2022-12-07 MED ORDER — SODIUM CHLORIDE 0.9 % IV SOLN
2.0000 g | INTRAVENOUS | Status: DC
  Administered 2022-12-08 – 2022-12-09 (×2): 2 g via INTRAVENOUS
  Filled 2022-12-07 (×3): qty 20

## 2022-12-07 MED ORDER — TRAZODONE HCL 50 MG PO TABS
25.0000 mg | ORAL_TABLET | Freq: Every evening | ORAL | Status: DC | PRN
  Administered 2022-12-07 – 2022-12-09 (×3): 25 mg via ORAL
  Filled 2022-12-07 (×3): qty 1

## 2022-12-07 MED ORDER — MAGNESIUM HYDROXIDE 400 MG/5ML PO SUSP: 30.0000 mL | Freq: Every day | ORAL | Status: DC | PRN

## 2022-12-07 MED ORDER — ACETAMINOPHEN 650 MG RE SUPP: 650.0000 mg | Freq: Four times a day (QID) | RECTAL | Status: DC | PRN

## 2022-12-07 MED ORDER — METHYLPREDNISOLONE SODIUM SUCC 125 MG IJ SOLR
125.0000 mg | Freq: Once | INTRAMUSCULAR | Status: AC
  Administered 2022-12-07: 125 mg via INTRAVENOUS
  Filled 2022-12-07: qty 2

## 2022-12-07 MED ORDER — GUAIFENESIN ER 600 MG PO TB12
600.0000 mg | ORAL_TABLET | Freq: Two times a day (BID) | ORAL | Status: DC
  Administered 2022-12-07 – 2022-12-10 (×6): 600 mg via ORAL
  Filled 2022-12-07 (×6): qty 1

## 2022-12-07 MED ORDER — LORAZEPAM 1 MG PO TABS
1.0000 mg | ORAL_TABLET | Freq: Once | ORAL | Status: AC
  Administered 2022-12-07: 1 mg via ORAL
  Filled 2022-12-07: qty 1

## 2022-12-07 MED ORDER — SODIUM CHLORIDE 0.9 % IV SOLN
500.0000 mg | INTRAVENOUS | Status: DC
  Administered 2022-12-07: 500 mg via INTRAVENOUS
  Filled 2022-12-07: qty 5

## 2022-12-07 MED ORDER — LORAZEPAM 0.5 MG PO TABS
0.5000 mg | ORAL_TABLET | ORAL | Status: DC | PRN
  Administered 2022-12-07 – 2022-12-09 (×7): 0.5 mg via ORAL
  Filled 2022-12-07 (×7): qty 1

## 2022-12-07 MED ORDER — NICOTINE 7 MG/24HR TD PT24
7.0000 mg | MEDICATED_PATCH | Freq: Every day | TRANSDERMAL | Status: DC
  Administered 2022-12-08 – 2022-12-09 (×2): 7 mg via TRANSDERMAL
  Filled 2022-12-07 (×4): qty 1

## 2022-12-07 MED ORDER — ALBUTEROL SULFATE HFA 108 (90 BASE) MCG/ACT IN AERS: 2.0000 | INHALATION_SPRAY | RESPIRATORY_TRACT | Status: DC | PRN

## 2022-12-07 MED ORDER — ONDANSETRON HCL 4 MG PO TABS: 4.0000 mg | ORAL_TABLET | Freq: Four times a day (QID) | ORAL | Status: DC | PRN

## 2022-12-07 MED ORDER — ENOXAPARIN SODIUM 40 MG/0.4ML IJ SOSY
40.0000 mg | PREFILLED_SYRINGE | INTRAMUSCULAR | Status: DC
  Administered 2022-12-07 – 2022-12-09 (×3): 40 mg via SUBCUTANEOUS
  Filled 2022-12-07 (×3): qty 0.4

## 2022-12-07 MED ORDER — SODIUM CHLORIDE 0.9 % IV SOLN
500.0000 mg | INTRAVENOUS | Status: DC
  Administered 2022-12-08 – 2022-12-09 (×2): 500 mg via INTRAVENOUS
  Filled 2022-12-07 (×3): qty 5

## 2022-12-07 MED ORDER — HYDROCOD POLI-CHLORPHE POLI ER 10-8 MG/5ML PO SUER: 5.0000 mL | Freq: Two times a day (BID) | ORAL | Status: DC | PRN

## 2022-12-07 MED ORDER — IPRATROPIUM-ALBUTEROL 0.5-2.5 (3) MG/3ML IN SOLN
3.0000 mL | Freq: Four times a day (QID) | RESPIRATORY_TRACT | Status: DC
  Administered 2022-12-07 – 2022-12-09 (×6): 3 mL via RESPIRATORY_TRACT
  Filled 2022-12-07 (×5): qty 3

## 2022-12-07 MED ORDER — ONDANSETRON HCL 4 MG/2ML IJ SOLN: 4.0000 mg | Freq: Four times a day (QID) | INTRAMUSCULAR | Status: DC | PRN

## 2022-12-07 MED ORDER — ACETAMINOPHEN 325 MG PO TABS: 650.0000 mg | ORAL_TABLET | Freq: Four times a day (QID) | ORAL | Status: DC | PRN
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
## 2022-12-07 NOTE — Assessment & Plan Note (Signed)
-   This is clearly secondary to #1. - O2 protocol will be followed. - Management otherwise as above. 
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
## 2022-12-07 NOTE — Assessment & Plan Note (Signed)
-   We will place him on NicoDerm CQ patch.

## 2022-12-07 NOTE — Assessment & Plan Note (Signed)
-   The patient will be admitted to a medical telemetry bed. - Will continue antibiotic therapy with IV Rocephin and Zithromax. - Mucolytic therapy be provided as well as duo nebs q.i.d. and q.4 hours p.r.n. - We will follow blood cultures. - Sepsis manifested by leukocytosis, tachycardia and tachypnea. - We will continue hydration with IV lactated ringer.
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
## 2022-12-08 ENCOUNTER — Inpatient Hospital Stay (HOSPITAL_COMMUNITY)
Admit: 2022-12-08 | Discharge: 2022-12-08 | Disposition: A | Discharge status: Home / Self Care | Payer: No Typology Code available for payment source | Attending: Internal Medicine | Admitting: Internal Medicine

## 2022-12-08 DIAGNOSIS — J189 Pneumonia, unspecified organism: Secondary | ICD-10-CM

## 2022-12-08 DIAGNOSIS — J9601 Acute respiratory failure with hypoxia: Secondary | ICD-10-CM

## 2022-12-08 DIAGNOSIS — F1410 Cocaine abuse, uncomplicated: Secondary | ICD-10-CM

## 2022-12-08 DIAGNOSIS — A419 Sepsis, unspecified organism: Secondary | ICD-10-CM

## 2022-12-08 DIAGNOSIS — Z720 Tobacco use: Secondary | ICD-10-CM

## 2022-12-08 DIAGNOSIS — R0609 Other forms of dyspnea: Secondary | ICD-10-CM

## 2022-12-08 LAB — CBC
HCT: 45.2 % (ref 39.0–52.0)
Hemoglobin: 15.8 g/dL (ref 13.0–17.0)
MCH: 32.2 pg (ref 26.0–34.0)
MCHC: 35 g/dL (ref 30.0–36.0)
MCV: 92.1 fL (ref 80.0–100.0)
Platelets: 434 10*3/uL — ABNORMAL HIGH (ref 150–400)
RBC: 4.91 MIL/uL (ref 4.22–5.81)
RDW: 11.5 % (ref 11.5–15.5)
WBC: 5.4 10*3/uL (ref 4.0–10.5)
nRBC: 0 % (ref 0.0–0.2)

## 2022-12-08 LAB — PROCALCITONIN: Procalcitonin: <0.1 ng/mL

## 2022-12-08 LAB — BASIC METABOLIC PANEL
Anion gap: 8 (ref 5–15)
BUN: 16 mg/dL (ref 6–20)
CO2: 22 mmol/L (ref 22–32)
Calcium: 8.8 mg/dL — ABNORMAL LOW (ref 8.9–10.3)
Chloride: 108 mmol/L (ref 98–111)
Creatinine, Ser: 0.94 mg/dL (ref 0.61–1.24)
GFR, Estimated: >60 mL/min (ref >60)
Glucose, Bld: 155 mg/dL — ABNORMAL HIGH (ref 70–99)
Potassium: 4.9 mmol/L (ref 3.5–5.1)
Sodium: 138 mmol/L (ref 135–145)

## 2022-12-08 LAB — HIV ANTIBODY (ROUTINE TESTING W REFLEX): HIV Screen 4th Generation wRfx: NONREACTIVE

## 2022-12-08 LAB — CORTISOL-AM, BLOOD: Cortisol - AM: 3.4 ug/dL — ABNORMAL LOW (ref 6.7–22.6)

## 2022-12-08 LAB — PROTIME-INR
INR: 1 (ref 0.8–1.2)
Prothrombin Time: 13.6 seconds (ref 11.4–15.2)

## 2022-12-08 MED ORDER — METHYLPREDNISOLONE SODIUM SUCC 40 MG IJ SOLR
40.0000 mg | Freq: Two times a day (BID) | INTRAMUSCULAR | Status: DC
  Administered 2022-12-08 – 2022-12-10 (×5): 40 mg via INTRAVENOUS
  Filled 2022-12-08 (×5): qty 1

## 2022-12-08 MED ORDER — SODIUM CHLORIDE 0.9 % IV SOLN: INTRAVENOUS | Status: DC

## 2022-12-08 NOTE — Plan of Care (Signed)

## 2022-12-08 NOTE — Progress Notes (Signed)
  Progress Note   Patient: Joseph Cohen ZOX:[id number] DOB: 01-Aug-1980 DOA: 12/07/2022     1 DOS: the patient was seen and examined on 12/08/2022   Brief hospital course: Elric Glavin is a 42 y.o. male with medical history significant for tobacco and cocaine abuse, who quit cocaine for 7 years and went back couple months ago.  He stated that "I was in a couple of months crack binge".  He has productive cough, dyspnea on exertion.  Patient is noted to be hypoxic requiring 2-4L supplemental oxygen.  Chest x-ray showed patchy infiltrates bilateral lower lung field suggesting multifocal pneumonia, WBC count 14, admitted to hospitalist service for evaluation of sepsis secondary to pneumonia.  Assessment and Plan: * Sepsis due to pneumonia (HCC) Continue to closely monitor vitals. Continue IV Rocephin and Zithromax. Continue pulmonary toilet mucolytic therapy be provided as well as duo nebs q.i.d. and q.4 hours p.r.n. Follow blood cultures. Continue sepsis protocol IV hydration. Encourage intensive spirometry, out of bed to chair  Acute respiratory failure with hypoxia (HCC) Due to multi focal pneumonia seen on chest x-ray Continue to wean oxygen to maintain saturation greater than 92% Continue DuoNebs, antibiotic therapy.  Cocaine abuse (HCC) Smoking cessation counseled. Monitor for withdrawal symptoms. Substance abuse consult. As needed ordered.  Tobacco abuse- Smoking cessation counseled. Continue NicoDerm CQ patch.        Subjective: Patient is seen and examined today morning.  He is lying in bed with mild respiratory distress, productive cough.  He is on 4 L abdominal oxygen.  Eating fair.   Physical Exam: Vitals:   12/07/22 2049 12/07/22 2107 12/08/22 0331 12/08/22 0742  BP: 127/79  108/67 94/63  Pulse: 95 95 79 75  Resp: 18 18 18 18   Temp: 98.5 F (36.9 C)  98.5 F (36.9 C) 98 F (36.7 C)  TempSrc:    Oral  SpO2: 94% 92% 93% 93%  Weight:      Height:        General -4 built Caucasian male, no apparent respiratory distress HEENT - PERRLA, EOMI, atraumatic head, non tender sinuses. Lung - Clear, diffuse rhonchi with bibasilar Rales Heart - S1, S2 heard, no murmurs, rubs, no pedal edema Neuro - Alert, awake and oriented x 3, non focal exam. Skin - Warm and dry. Data Reviewed:  CBC, BMP, lactic acid, procal  Family Communication: Patient and his family at bedside understand and agree.  Disposition: Status is: Inpatient Remains inpatient appropriate because: sepsis, IV antibiotics, oxygen  Planned Discharge Destination: Home    Time spent: 44 minutes  Author: Marcelino Duster, MD 12/08/2022 3:43 PM  For on call review [email or web address].

## 2022-12-08 NOTE — Progress Notes (Signed)
  Echocardiogram 2D Echocardiogram has been performed.  Joseph Cohen 12/08/2022, 3:17 PM

## 2022-12-09 DIAGNOSIS — Z720 Tobacco use: Secondary | ICD-10-CM

## 2022-12-09 DIAGNOSIS — J9601 Acute respiratory failure with hypoxia: Secondary | ICD-10-CM

## 2022-12-09 DIAGNOSIS — J189 Pneumonia, unspecified organism: Secondary | ICD-10-CM

## 2022-12-09 DIAGNOSIS — A419 Sepsis, unspecified organism: Principal | ICD-10-CM

## 2022-12-09 DIAGNOSIS — F1410 Cocaine abuse, uncomplicated: Secondary | ICD-10-CM

## 2022-12-09 LAB — ECHOCARDIOGRAM COMPLETE
AR max vel: 2.31 cm2
AV Area VTI: 2.34 cm2
AV Area mean vel: 2.2 cm2
AV Mean grad: 10 mmHg
AV Peak grad: 19 mmHg
Ao pk vel: 2.18 m/s
Area-P 1/2: 4.57 cm2
Height: 68 in
S' Lateral: 2.9 cm
Weight: 2080 oz

## 2022-12-09 MED ORDER — IPRATROPIUM-ALBUTEROL 0.5-2.5 (3) MG/3ML IN SOLN
3.0000 mL | Freq: Two times a day (BID) | RESPIRATORY_TRACT | Status: DC
  Administered 2022-12-09 – 2022-12-10 (×2): 3 mL via RESPIRATORY_TRACT
  Filled 2022-12-09 (×2): qty 3

## 2022-12-09 NOTE — Progress Notes (Signed)
  Progress Note   Patient: Joseph Cohen EXB:[id number] DOB: [birth year] DOA: 12/07/2022     2 DOS: the patient was seen and examined on 12/09/2022   Brief hospital course: Joseph Cohen is a 42 y.o. male with medical history significant for tobacco and cocaine abuse, who quit cocaine for 7 years and went back couple months ago.  He stated that "I was in a couple of months crack binge".  He has productive cough, dyspnea on exertion.  Patient is noted to be hypoxic requiring 2-4L supplemental oxygen.  Chest x-ray showed patchy infiltrates bilateral lower lung field suggesting multifocal pneumonia, WBC count 14, admitted to hospitalist service for evaluation of sepsis secondary to multifocal pneumonia.  Assessment and Plan: * Sepsis due to pneumonia (HCC) Continue to closely monitor vitals. Continue IV Rocephin and Zithromax for 5 days. Continue pulmonary toilet mucolytic therapy be provided as well as duo nebs q.i.d. and q.4 hours p.r.n. Follow blood cultures. Continue sepsis protocol IV hydration. Encourage intensive spirometry, out of bed to chair  Acute respiratory failure with hypoxia (HCC) Due to multi focal pneumonia seen on chest x-ray. Continue antibiotics, currently on 2L, wean as tolerated. Echo reviewed shows normal EF. Continue DuoNebs, antibiotic therapy.  Cocaine abuse (HCC) Smoking cessation counseled. Monitor for withdrawal symptoms. Substance abuse consult. As needed ordered.  Tobacco abuse- Smoking cessation counseled. Continue NicoDerm CQ patch.        Subjective: Patient is seen and examined today morning.  He is lying in bed with mild respiratory distress, productive cough.  He is on 4 L abdominal oxygen.  Eating fair.   Physical Exam: Vitals:   12/08/22 1551 12/08/22 1933 12/09/22 0340 12/09/22 0816  BP: 123/65 111/71 97/66 97/66   Pulse:  (!) 103 79 89  Resp: 18 20 20 18   Temp: 97.8 F (36.6 C) 97.7 F (36.5 C) 98.1 F (36.7 C) (!) 97.4 F (36.3  C)  TempSrc: Oral Oral Oral Oral  SpO2: 94% 94% 94% 94%  Weight:      Height:       General -15 built Caucasian male, no apparent respiratory distress HEENT - PERRLA, EOMI, atraumatic head, non tender sinuses. Lung - Clear, diffuse rhonchi. Heart - S1, S2 heard, no murmurs, rubs, no pedal edema Neuro - Alert, awake and oriented x 3, non focal exam. Skin - Warm and dry. Data Reviewed:  CBC, BMP, lactic acid, procal  Family Communication: Patient and his family at bedside understand and agree.  Disposition: Status is: Inpatient Remains inpatient appropriate because: sepsis, IV antibiotics, oxygen  Planned Discharge Destination: Home    Time spent: 43 minutes  Author: Marcelino Duster, MD 12/09/2022 11:33 AM  For on call review [email or web address].

## 2022-12-09 NOTE — Plan of Care (Signed)
The patient continues to be on 2 liters of oxygen. PRN anxiety given today.  Problem: Fluid Volume: Goal: Hemodynamic stability will improve Outcome: Progressing   Problem: Clinical Measurements: Goal: Diagnostic test results will improve Outcome: Progressing Goal: Signs and symptoms of infection will decrease Outcome: Progressing   Problem: Respiratory: Goal: Ability to maintain adequate ventilation will improve Outcome: Progressing   Problem: Education: Goal: Knowledge of General Education information will improve Description: Including pain rating scale, medication(s)/side effects and non-pharmacologic comfort measures Outcome: Progressing   Problem: Health Behavior/Discharge Planning: Goal: Ability to manage health-related needs will improve Outcome: Progressing   Problem: Clinical Measurements: Goal: Ability to maintain clinical measurements within normal limits will improve Outcome: Progressing Goal: Will remain free from infection Outcome: Progressing Goal: Diagnostic test results will improve Outcome: Progressing Goal: Respiratory complications will improve Outcome: Progressing Goal: Cardiovascular complication will be avoided Outcome: Progressing   Problem: Nutrition: Goal: Adequate nutrition will be maintained Outcome: Progressing   Problem: Activity: Goal: Risk for activity intolerance will decrease Outcome: Progressing   Problem: Coping: Goal: Level of anxiety will decrease Outcome: Progressing   Problem: Elimination: Goal: Will not experience complications related to bowel motility Outcome: Progressing Goal: Will not experience complications related to urinary retention Outcome: Progressing

## 2022-12-09 NOTE — Plan of Care (Signed)

## 2022-12-10 MED ORDER — NICOTINE 7 MG/24HR TD PT24: 7.0000 mg | MEDICATED_PATCH | Freq: Every day | TRANSDERMAL | 0 refills | Status: DC

## 2022-12-10 MED ORDER — AMOXICILLIN-POT CLAVULANATE 500-125 MG PO TABS: 1.0000 | ORAL_TABLET | Freq: Three times a day (TID) | ORAL | 0 refills | Status: AC

## 2022-12-10 MED ORDER — GUAIFENESIN 100 MG/5ML PO LIQD: 5.0000 mL | ORAL | 0 refills | Status: DC | PRN

## 2022-12-10 NOTE — Discharge Summary (Signed)
Physician Discharge Summary   Patient: Joseph Cohen MRN: [id number] DOB: [birth year]  Admit date:     12/07/2022  Discharge date: 12/10/22  Discharge Physician: Marcelino Duster   PCP: Erasmo Downer, MD   Recommendations at discharge:    PCP follow up in 1 week Smoking cessation, stop illicit drugs counseled.  Discharge Diagnoses: Principal Problem:   Sepsis due to pneumonia [facility]) Active Problems:   Acute respiratory failure with hypoxia (HCC)   Cocaine abuse (HCC)   Tobacco abuse  Resolved Problems:   * No resolved hospital problems. *  Hospital Course: Khamari Sheehan is a 42 y.o. male with medical history significant for tobacco and cocaine abuse, who quit cocaine for 7 years and went back couple months ago.  He stated that "I was in a couple of months crack binge".  He has productive cough, dyspnea on exertion.  Patient is noted to be hypoxic requiring 2-4L supplemental oxygen.  Chest x-ray showed patchy infiltrates bilateral lower lung field suggesting multifocal pneumonia, WBC count 14, admitted to hospitalist service for evaluation of sepsis secondary to multifocal pneumonia.  Patient is started on supplemental oxygen, IV Rocephin, Zithromax, DuoNebs, steroids.  Patient is advised to stop smoking, doing cocaine.  Patient's symptoms gradually improved and today he is off of oxygen, able to ambulate.  Patient wishes to go home.  He is hemodynamically stable to be discharged home on oral antibiotics, PCP follow-up.  Patient understands and agrees with the discharge plan.        Consultants: none Procedures performed: none  Disposition: Home Diet recommendation:  Discharge Diet Orders (From admission, onward)     Start     Ordered   12/10/22 0000  Diet - low sodium heart healthy        12/10/22 1001           Cardiac diet DISCHARGE MEDICATION: Allergies as of 12/10/2022   No Known Allergies      Medication List     TAKE these medications     amoxicillin-clavulanate 500-125 MG tablet Commonly known as: Augmentin Take 1 tablet by mouth 3 (three) times daily for 5 days.   guaiFENesin 100 MG/5ML liquid Commonly known as: ROBITUSSIN Take 5 mLs by mouth every 4 (four) hours as needed for cough or to loosen phlegm.   nicotine 7 mg/24hr patch Commonly known as: NICODERM CQ - dosed in mg/24 hr Place 1 patch (7 mg total) onto the skin daily. Start taking on: December 11, 2022        Discharge Exam: Ceasar Mons Weights   12/07/22 1802  Weight: 59 kg   General -3 built Caucasian male, no apparent respiratory distress HEENT - PERRLA, EOMI, atraumatic head, non tender sinuses. Lung - Clear, diffuse rhonchi. Heart - S1, S2 heard, no murmurs, rubs, no pedal edema Neuro - Alert, awake and oriented x 3, non focal exam. Skin - Warm and dry.  Condition at discharge: stable  The results of significant diagnostics from this hospitalization (including imaging, microbiology, ancillary and laboratory) are listed below for reference.   Imaging Studies: ECHOCARDIOGRAM COMPLETE  Result Date: 12/09/2022    ECHOCARDIOGRAM REPORT   Patient Name:   Joseph Cohen Date of Exam: 12/08/2022 Medical Rec #:  [id number]        Height:       68.0 in Accession #:    [id number]       Weight:       130.0 lb Date of Birth:  04/16/1981  BSA:          1.702 m Patient Age:    42 years         BP:           94/63 mmHg Patient Gender: M                HR:           97 bpm. Exam Location:  [facility] Procedure: 2D Echo and Strain Analysis Indications:     Dyspnea R06.00  History:         Patient has no prior history of Echocardiogram examinations.  Sonographer:     Overton Mam RDCS, FASE Referring Phys:  [id number] Marcelino Duster Diagnosing Phys: Lorine Bears MD  Sonographer Comments: Global longitudinal strain was attempted. IMPRESSIONS  1. Left ventricular ejection fraction, by estimation, is 60 to 65%. The left ventricle has normal function. The left  ventricle has no regional wall motion abnormalities. Left ventricular diastolic parameters were normal. The average left ventricular global longitudinal strain is -17.1 %. The global longitudinal strain is normal.  2. Right ventricular systolic function is normal. The right ventricular size is normal. Tricuspid regurgitation signal is inadequate for assessing PA pressure.  3. The mitral valve is normal in structure. No evidence of mitral valve regurgitation. No evidence of mitral stenosis.  4. The aortic valve is normal in structure. Aortic valve regurgitation is not visualized. No aortic stenosis is present.  5. The inferior vena cava is normal in size with greater than 50% respiratory variability, suggesting right atrial pressure of 3 mmHg. FINDINGS  Left Ventricle: Left ventricular ejection fraction, by estimation, is 60 to 65%. The left ventricle has normal function. The left ventricle has no regional wall motion abnormalities. The average left ventricular global longitudinal strain is -17.1 %. The global longitudinal strain is normal. The left ventricular internal cavity size was normal in size. There is no left ventricular hypertrophy. Left ventricular diastolic parameters were normal. Right Ventricle: The right ventricular size is normal. No increase in right ventricular wall thickness. Right ventricular systolic function is normal. Tricuspid regurgitation signal is inadequate for assessing PA pressure. Left Atrium: Left atrial size was normal in size. Right Atrium: Right atrial size was normal in size. Pericardium: There is no evidence of pericardial effusion. Mitral Valve: The mitral valve is normal in structure. No evidence of mitral valve regurgitation. No evidence of mitral valve stenosis. Tricuspid Valve: The tricuspid valve is normal in structure. Tricuspid valve regurgitation is trivial. No evidence of tricuspid stenosis. Aortic Valve: The aortic valve is normal in structure. Aortic valve  regurgitation is not visualized. No aortic stenosis is present. Aortic valve mean gradient measures 10.0 mmHg. Aortic valve peak gradient measures 19.0 mmHg. Aortic valve area, by VTI measures 2.34 cm. Pulmonic Valve: The pulmonic valve was normal in structure. Pulmonic valve regurgitation is not visualized. No evidence of pulmonic stenosis. Aorta: The aortic root is normal in size and structure. Venous: The inferior vena cava is normal in size with greater than 50% respiratory variability, suggesting right atrial pressure of 3 mmHg. IAS/Shunts: No atrial level shunt detected by color flow Doppler.  LEFT VENTRICLE PLAX 2D LVIDd:         4.50 cm   Diastology LVIDs:         2.90 cm   LV e' medial:    12.10 cm/s LV PW:         1.00 cm   LV E/e' medial:  6.7 LV  IVS:        0.80 cm   LV e' lateral:   19.90 cm/s LVOT diam:     1.80 cm   LV E/e' lateral: 4.1 LV SV:         71 LV SV Index:   42        2D Longitudinal Strain LVOT Area:     2.54 cm  2D Strain GLS Avg:     -17.1 %  RIGHT VENTRICLE RV Basal diam:  3.60 cm RV S prime:     21.90 cm/s TAPSE (M-mode): 2.6 cm LEFT ATRIUM             Index        RIGHT ATRIUM           Index LA diam:        2.60 cm 1.53 cm/m   RA Area:     13.60 cm LA Vol (A2C):   19.7 ml 11.58 ml/m  RA Volume:   35.50 ml  20.86 ml/m LA Vol (A4C):   12.3 ml 7.23 ml/m LA Biplane Vol: 15.6 ml 9.17 ml/m  AORTIC VALVE                     PULMONIC VALVE AV Area (Vmax):    2.31 cm      PV Vmax:       1.14 m/s AV Area (Vmean):   2.20 cm      PV Peak grad:  5.2 mmHg AV Area (VTI):     2.34 cm AV Vmax:           218.00 cm/s AV Vmean:          142.000 cm/s AV VTI:            0.304 m AV Peak Grad:      19.0 mmHg AV Mean Grad:      10.0 mmHg LVOT Vmax:         198.00 cm/s LVOT Vmean:        123.000 cm/s LVOT VTI:          0.279 m LVOT/AV VTI ratio: 0.92  AORTA Ao Root diam: 3.20 cm Ao Asc diam:  3.00 cm MITRAL VALVE MV Area (PHT): 4.57 cm    SHUNTS MV Decel Time: 166 msec    Systemic VTI:  0.28 m MV  E velocity: 81.20 cm/s  Systemic Diam: 1.80 cm MV A velocity: 76.00 cm/s MV E/A ratio:  1.07 Lorine Bears MD Electronically signed by Lorine Bears MD Signature Date/Time: 12/09/2022/7:38:54 AM    Final    DG Chest 2 View  Result Date: 12/07/2022 CLINICAL DATA:  Shortness of breath, hypoxia EXAM: CHEST - 2 VIEW COMPARISON:  12/29/2020 FINDINGS: Transverse diameter of heart is within normal limits. There are no signs of alveolar pulmonary edema. There are new patchy infiltrates in both lower lung fields, more so on the left side. Costophrenic angles are clear. There is no pneumothorax. IMPRESSION: There are new patchy infiltrates in both lower lung fields, more so on the left side suggesting multifocal atelectasis/pneumonia. Electronically Signed   By: Ernie Avena M.D.   On: 12/07/2022 18:48    Microbiology: Results for orders placed or performed during the hospital encounter of 12/07/22  Resp panel by RT-PCR (RSV, Flu A&B, Covid) Anterior Nasal Swab     Status: None   Collection Time: 12/07/22  7:53 PM   Specimen: Anterior Nasal Swab  Result Value Ref Range  Status   SARS Coronavirus 2 by RT PCR NEGATIVE NEGATIVE Final    Comment: (NOTE) SARS-CoV-2 target nucleic acids are NOT DETECTED.  The SARS-CoV-2 RNA is generally detectable in upper respiratory specimens during the acute phase of infection. The lowest concentration of SARS-CoV-2 viral copies this assay can detect is 138 copies/mL. A negative result does not preclude SARS-Cov-2 infection and should not be used as the sole basis for treatment or other patient management decisions. A negative result may occur with  improper specimen collection/handling, submission of specimen other than nasopharyngeal swab, presence of viral mutation(s) within the areas targeted by this assay, and inadequate number of viral copies(<138 copies/mL). A negative result must be combined with clinical observations, patient history, and  epidemiological information. The expected result is Negative.  Fact Sheet for Patients:  BloggerCourse.com  Fact Sheet for Healthcare Providers:  SeriousBroker.it  This test is no t yet approved or cleared by the [place] FDA and  has been authorized for detection and/or diagnosis of SARS-CoV-2 by FDA under an Emergency Use Authorization (EUA). This EUA will remain  in effect (meaning this test can be used) for the duration of the COVID-19 declaration under Section 564(b)(1) of the Act, 21 U.S.C.section 360bbb-3(b)(1), unless the authorization is terminated  or revoked sooner.       Influenza A by PCR NEGATIVE NEGATIVE Final   Influenza B by PCR NEGATIVE NEGATIVE Final    Comment: (NOTE) The Xpert Xpress SARS-CoV-2/FLU/RSV plus assay is intended as an aid in the diagnosis of influenza from Nasopharyngeal swab specimens and should not be used as a sole basis for treatment. Nasal washings and aspirates are unacceptable for Xpert Xpress SARS-CoV-2/FLU/RSV testing.  Fact Sheet for Patients: BloggerCourse.com  Fact Sheet for Healthcare Providers: SeriousBroker.it  This test is not yet approved or cleared by the [place] FDA and has been authorized for detection and/or diagnosis of SARS-CoV-2 by FDA under an Emergency Use Authorization (EUA). This EUA will remain in effect (meaning this test can be used) for the duration of the COVID-19 declaration under Section 564(b)(1) of the Act, 21 U.S.C. section 360bbb-3(b)(1), unless the authorization is terminated or revoked.     Resp Syncytial Virus by PCR NEGATIVE NEGATIVE Final    Comment: (NOTE) Fact Sheet for Patients: BloggerCourse.com  Fact Sheet for Healthcare Providers: SeriousBroker.it  This test is not yet approved or cleared by the [place] FDA and has been  authorized for detection and/or diagnosis of SARS-CoV-2 by FDA under an Emergency Use Authorization (EUA). This EUA will remain in effect (meaning this test can be used) for the duration of the COVID-19 declaration under Section 564(b)(1) of the Act, 21 U.S.C. section 360bbb-3(b)(1), unless the authorization is terminated or revoked.  Performed at [facility], [address] [place], [place] [address]   Blood Culture (routine x 2)     Status: None (Preliminary result)   Collection Time: 12/07/22  7:53 PM   Specimen: BLOOD RIGHT ARM  Result Value Ref Range Status   Specimen Description BLOOD RIGHT ARM  Final   Special Requests   Final    BOTTLES DRAWN AEROBIC AND ANAEROBIC Blood Culture results may not be optimal due to an excessive volume of blood received in culture bottles   Culture   Final    NO GROWTH 3 DAYS Performed at [facility], [address], [place], [place] [address]    Report Status PENDING  Incomplete  Blood Culture (routine x 2)  Status: None (Preliminary result)   Collection Time: 12/07/22  9:39 PM   Specimen: BLOOD LEFT HAND  Result Value Ref Range Status   Specimen Description BLOOD LEFT HAND  Final   Special Requests   Final    BOTTLES DRAWN AEROBIC AND ANAEROBIC Blood Culture adequate volume   Culture   Final    NO GROWTH 3 DAYS Performed at [facility], [address] [place], [place] [address]    Report Status PENDING  Incomplete    Labs: CBC: Recent Labs  Lab 12/07/22 1806 12/08/22 0542  WBC 14.0* 5.4  NEUTROABS 12.0*  --   HGB 17.3* 15.8  HCT 49.8 45.2  MCV 92.2 92.1  PLT 482* 434*   Basic Metabolic Panel: Recent Labs  Lab 12/07/22 1806 12/08/22 0542  NA 137 138  K 3.8 4.9  CL 103 108  CO2 23 22  GLUCOSE 100* 155*  BUN 9 16  CREATININE 0.89 0.94  CALCIUM 8.8* 8.8*   Liver Function Tests: Recent Labs  Lab 12/07/22 1806  AST 21  ALT 42  ALKPHOS 86  BILITOT 0.7  PROT 8.0  ALBUMIN 4.1    CBG: No results for input(s): "GLUCAP" in the last 168 hours.  Discharge time spent: greater than 30 minutes.  Signed: Marcelino Duster, MD [facility] 12/10/2022

## 2022-12-10 NOTE — TOC CM/SW Note (Signed)
Transition of Care [facility]) - Inpatient Brief Assessment   Patient Details  Name: Hridaan Bouse MRN: [id number] Date of Birth: 19-Oct-1980  Transition of Care [facility]) CM/SW Contact:    Chapman Fitch, RN Phone Number: 12/10/2022, 10:21 AM   Clinical Narrative:  PCP listed as Beryle Flock Patient listed as self pay,   Listed resources for [facility]  on AVS Provided goodrx coupon for discharge medication.  Patient confirms he will be able to obtain at discharge Patient did not qualify for home O2  Transition of Care Asessment: Insurance and Status: Selfpay Patient has primary care physician: Yes     Prior/Current Home Services: No current home services Social Determinants of Health Reivew: SDOH reviewed no interventions necessary Readmission risk has been reviewed: Yes Transition of care needs: no transition of care needs at this time

## 2022-12-10 NOTE — Plan of Care (Signed)
Adequate for discharge, resolve cp.  Joseph Cohen

## 2022-12-10 NOTE — Progress Notes (Signed)
SATURATION QUALIFICATIONS: (This note is used to comply with regulatory documentation for home oxygen)  Patient Saturations on Room Air at Rest = 92%  Patient Saturations on Room Air while Ambulating = 89%  Patient Saturations on 0 Liters of oxygen while Ambulating = 96%  Please briefly explain why patient needs home oxygen:

## 2022-12-10 NOTE — Discharge Instructions (Signed)
Some PCP options in [place] area- not a comprehensive list  [facility]- [phone] Corinda Gubler- [phone] [facility]- [phone] [facility]- [phone] [facility]- [phone] Lutricia Horsfall- [phone]  or [facility] Physician Referral Line [phone]  You are encouraged to [facility] and arrange an application appointment to be screened for service to get set up with a physician for primary care  You may print the application and take with you to the appointment. At this web address [email or web address] Please Call [facility] for appointment  [phone]  [address] [place], [place] [address]  Office Hours Monday: Closed Tuesday: 9:00am - 4:00pm Wednesday: 9:00am - 4:00pm Thursday: 9:00am - 8:00pm Friday: Closed  Endocrinology 2nd Thursday of the Month: 5:00pm - 8:00pm  Rheumatology/Orthopedic Clinic By appointment only.  Contact for availability.     Services Not Covered Obstetrics Gastrointestinal/Liver Disease

## 2022-12-10 NOTE — Progress Notes (Signed)
I have reviewed and concur with this student's documentation. CI, RN was present during the med administration.   Leodis Sias, RN 12/10/2022 10:22 AM

## 2022-12-11 ENCOUNTER — Telehealth: Payer: Self-pay

## 2022-12-11 NOTE — Transitions of Care (Post Inpatient/ED Visit) (Signed)
   12/11/2022  Name: Joseph Cohen MRN: [id number] DOB: September 03, 1980  Today's TOC FU Call Status: Today's TOC FU Call Status:: Successful TOC FU Call Competed TOC FU Call Complete Date: 12/11/22  Transition Care Management Follow-up Telephone Call Date of Discharge: 12/10/22 Discharge Facility: [facility]) Type of Discharge: Inpatient Admission Primary Inpatient Discharge Diagnosis:: poisoning by cocaine How have you been since you were released from the hospital?: Better Any questions or concerns?: No  Items Reviewed: Did you receive and understand the discharge instructions provided?: Yes Medications obtained,verified, and reconciled?: Yes (Medications Reviewed) Any new allergies since your discharge?: No Dietary orders reviewed?: Yes Do you have support at home?: Yes People in Home: significant other  Medications Reviewed Today: Medications Reviewed Today     Reviewed by Karena Addison, LPN (Licensed Practical Nurse) on 12/11/22 at [phone]  Med List Status: <None>   Medication Order Taking? Sig Documenting Provider Last Dose Status Informant  amoxicillin-clavulanate (AUGMENTIN) 500-125 MG tablet [id number]  Take 1 tablet by mouth 3 (three) times daily for 5 days. Marcelino Duster, MD  Active   guaiFENesin (ROBITUSSIN) 100 MG/5ML liquid [id number]  Take 5 mLs by mouth every 4 (four) hours as needed for cough or to loosen phlegm. Marcelino Duster, MD  Active   nicotine (NICODERM CQ - DOSED IN MG/24 HR) 7 mg/24hr patch [id number]  Place 1 patch (7 mg total) onto the skin daily. Marcelino Duster, MD  Active             Home Care and Equipment/Supplies: Were Home Health Services Ordered?: NA Any new equipment or medical supplies ordered?: NA  Functional Questionnaire: Do you need assistance with bathing/showering or dressing?: No Do you need assistance with meal preparation?: No Do you need assistance with eating?: No Do you have  difficulty maintaining continence: No Do you need assistance with getting out of bed/getting out of a chair/moving?: No Do you have difficulty managing or taking your medications?: No  Follow up appointments reviewed: PCP Follow-up appointment confirmed?: Yes Date of PCP follow-up appointment?: 12/23/22 Follow-up Provider: [facility] Follow-up appointment confirmed?: NA Do you need transportation to your follow-up appointment?: No Do you understand care options if your condition(s) worsen?: Yes-patient verbalized understanding    SIGNATURE Karena Addison, LPN [facility] Nurse Health Advisor Direct Dial [phone]

## 2022-12-12 LAB — CULTURE, BLOOD (ROUTINE X 2)
Culture: NO GROWTH
Culture: NO GROWTH
Special Requests: ADEQUATE

## 2022-12-23 ENCOUNTER — Encounter: Payer: Self-pay | Admitting: Family Medicine

## 2022-12-23 ENCOUNTER — Ambulatory Visit (INDEPENDENT_AMBULATORY_CARE_PROVIDER_SITE_OTHER): Payer: No Typology Code available for payment source | Admitting: Family Medicine

## 2022-12-23 VITALS — BP 107/84 | HR 101 | Temp 98.7°F | Resp 28 | Ht 68.0 in | Wt 125.9 lb

## 2022-12-23 DIAGNOSIS — F1410 Cocaine abuse, uncomplicated: Secondary | ICD-10-CM

## 2022-12-23 DIAGNOSIS — A419 Sepsis, unspecified organism: Secondary | ICD-10-CM

## 2022-12-23 DIAGNOSIS — J189 Pneumonia, unspecified organism: Secondary | ICD-10-CM

## 2022-12-23 DIAGNOSIS — J9601 Acute respiratory failure with hypoxia: Secondary | ICD-10-CM

## 2022-12-23 MED ORDER — ALBUTEROL SULFATE HFA 108 (90 BASE) MCG/ACT IN AERS: 2.0000 | INHALATION_SPRAY | Freq: Four times a day (QID) | RESPIRATORY_TRACT | 2 refills | Status: AC | PRN

## 2022-12-23 NOTE — Assessment & Plan Note (Signed)
Acute and improving. He is still having lingering SOB and sputum production. Etiology could be related to healing time or permanent lung damage. Will conduct further testing to ensure pna is resolved. CBC w/ diff  Albuterol prn for SOB

## 2022-12-23 NOTE — Progress Notes (Signed)
Established Patient Office Visit  Subjective   Patient ID: Joseph Cohen, male    DOB: 03-30-1981  Age: 42 y.o. MRN: [id number]  Chief Complaint  Patient presents with   Hospitalization Follow-up    Joseph Cohen is here today for a hospital follow up where he was diagnosed with pneumonia, hypoxic respiratory failure, and lung damage due to cocaine use. He completed his course of antibiotics in full. Since dc he has been experiencing shortness of breath with exertion and a productive cough. He has not had a fever or chills, or experienced SOB at rest. He is not feeling worse than when he left the hospital. He does not use tobacco. He says he is done with cocaine for good after the lung damage it caused.     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  Respiratory:  Positive for sputum production and shortness of breath. Negative for wheezing.       Objective:     BP 107/84 (BP Location: Left Arm, Patient Position: Sitting, Cuff Size: Normal)   Pulse (!) 101   Temp 98.7 F (37.1 C) (Temporal)   Resp (!) 28   Ht 5\' 8"  (1.727 m)   Wt 125 lb 14.4 oz (57.1 kg)   SpO2 92%   BMI 19.14 kg/m  SpO2 Readings from Last 3 Encounters:  12/23/22 92%  12/10/22 98%  12/29/20 99%      Physical Exam Cardiovascular:     Rate and Rhythm: Normal rate and regular rhythm.     Heart sounds: Normal heart sounds.  Pulmonary:     Effort: Pulmonary effort is normal.     Breath sounds: Normal breath sounds.  Neurological:     Mental Status: He is alert.  Psychiatric:        Mood and Affect: Mood is anxious.        Behavior: Behavior is  hyperactive.     No results found for any visits on 12/23/22.  Last CBC Lab Results  Component Value Date   WBC 5.4 12/08/2022   HGB 15.8 12/08/2022   HCT 45.2 12/08/2022   MCV 92.1 12/08/2022   MCH 32.2 12/08/2022   RDW 11.5 12/08/2022   PLT 434 (H) 12/08/2022   Last metabolic panel Lab Results  Component Value Date   GLUCOSE 155 (H) 12/08/2022   NA 138 12/08/2022   K 4.9 12/08/2022   CL 108 12/08/2022   CO2 22 12/08/2022   BUN 16 12/08/2022   CREATININE 0.94 12/08/2022   GFRNONAA >60 12/08/2022   CALCIUM 8.8 (L) 12/08/2022   PROT 8.0 12/07/2022   ALBUMIN 4.1 12/07/2022   LABGLOB 2.4 01/13/2020   AGRATIO 1.9 01/13/2020   BILITOT 0.7 12/07/2022   ALKPHOS 86 12/07/2022   AST 21 12/07/2022   ALT 42 12/07/2022   ANIONGAP 8 12/08/2022      The 10-year ASCVD risk score (Arnett DK, et al., 2019) is: 4.1%    Assessment & Plan:   Problem List Items Addressed This Visit       Respiratory   Sepsis due to pneumonia [facility]) - Primary  Acute and improving. He is still having lingering SOB and sputum production. Etiology could be related to healing time or permanent lung damage. Will conduct further testing to ensure pna is resolved. CBC w/ diff  Albuterol prn for SOB       Relevant Medications   albuterol (VENTOLIN HFA) 108 (90 Base) MCG/ACT inhaler   Other Relevant Orders   CBC w/Diff/Platelet   Acute respiratory failure with hypoxia (HCC)    Acute and improving. SpO2 was low at 91%.  CTM        Other   Cocaine abuse (HCC)    Acute. Patient is adamant that he will not use cocaine in the future. He realizes it likely resulted in lung damage. Patient also endorses understanding that he cannot use albuterol and cocaine simultaneously due to high risk of heart issues.       Return in about 3 months (around 03/25/2023) for CPE.    Shirlee Latch, MD   Patient seen along with MS3 student Jodi Marble. I personally evaluated this patient along with the  student, and verified all aspects of the history, physical exam, and medical decision making as documented by the student. I agree with the student's documentation and have made all necessary edits.  Josely Moffat, Marzella Schlein, MD, MPH [facility]

## 2022-12-23 NOTE — Assessment & Plan Note (Signed)
Acute and improving. SpO2 was low at 91%.  CTM

## 2022-12-23 NOTE — Assessment & Plan Note (Signed)
Acute. Patient is adamant that he will not use cocaine in the future. He realizes it likely resulted in lung damage. Patient also endorses understanding that he cannot use albuterol and cocaine simultaneously due to high risk of heart issues.

## 2022-12-25 ENCOUNTER — Other Ambulatory Visit: Payer: Self-pay

## 2022-12-25 ENCOUNTER — Telehealth: Payer: Self-pay

## 2022-12-25 DIAGNOSIS — D72829 Elevated white blood cell count, unspecified: Secondary | ICD-10-CM

## 2022-12-25 NOTE — Telephone Encounter (Signed)
Pt given lab results per notes of Dr. Beryle Flock on 12/25/22. Pt verbalized understanding.

## 2022-12-26 ENCOUNTER — Ambulatory Visit
Admission: RE | Admit: 2022-12-26 | Discharge: 2022-12-26 | Disposition: A | Discharge status: Home / Self Care | Payer: No Typology Code available for payment source | Attending: Family Medicine | Admitting: Family Medicine

## 2022-12-26 ENCOUNTER — Ambulatory Visit
Admission: RE | Admit: 2022-12-26 | Discharge: 2022-12-26 | Disposition: A | Discharge status: Home / Self Care | Payer: No Typology Code available for payment source | Source: Ambulatory Visit | Attending: Family Medicine | Admitting: Family Medicine

## 2022-12-26 DIAGNOSIS — D72829 Elevated white blood cell count, unspecified: Secondary | ICD-10-CM | POA: Insufficient documentation

## 2023-01-20 NOTE — Addendum Note (Signed)
Addended by: Erasmo Downer on: 01/20/2023 04:41 PM   Modules accepted: Level of Service

## 2023-03-25 ENCOUNTER — Encounter: Payer: Self-pay | Admitting: Family Medicine

## 2023-03-25 ENCOUNTER — Encounter: Payer: No Typology Code available for payment source | Admitting: Family Medicine

## 2023-03-25 NOTE — Progress Notes (Deleted)
Complete physical exam  Patient: Joseph Cohen   DOB: July 30, 1980   42 y.o. Male  MRN: [id number]  Subjective:    No chief complaint on file.   Joseph Cohen is a 42 y.o. male who presents today for a complete physical exam. He reports consuming a {diet types:17450} diet. {types:19826} He generally feels {DESC; WELL/FAIRLY WELL/POORLY:18703}. He reports sleeping {DESC; WELL/FAIRLY WELL/POORLY:18703}. He {does/does not:200015} have additional problems to discuss today.   Discussed the use of AI scribe software for clinical note transcription with the patient, who gave verbal consent to proceed.  History of Present Illness            Most recent fall risk assessment:    12/23/2022    3:08 PM  Fall Risk   Falls in the past year? 0  Number falls in past yr: 0  Injury with Fall? 0  Risk for fall due to : No Fall Risks  Follow up Falls evaluation completed     Most recent depression screenings:    12/23/2022    3:08 PM 01/15/2021    8:28 AM  PHQ 2/9 Scores  PHQ - 2 Score 0 0  PHQ- 9 Score  0    {VISON DENTAL STD PSA (Optional):27386}  {History (Optional):23778}  Patient Care Team: Erasmo Downer, MD as PCP - General (Family Medicine)   Outpatient Medications Prior to Visit  Medication Sig   albuterol (VENTOLIN HFA) 108 (90 Base) MCG/ACT inhaler Inhale 2 puffs into the lungs every 6 (six) hours as needed for wheezing or shortness of breath.   No facility-administered medications prior to visit.    ROS        Objective:     There were no vitals taken for this visit. {Vitals History (Optional):23777}  Physical Exam   No results found for any visits on 03/25/23. {Show previous labs (optional):23779}    Assessment & Plan:    Routine Health Maintenance and Physical Exam  Immunization History  Administered Date(s) Administered   Moderna Sars-Covid-2 Vaccination 09/02/2019, 09/30/2019, 05/05/2020   Tdap 12/11/2017    Health Maintenance   Topic Date Due   INFLUENZA VACCINE  Never done   COVID-19 Vaccine (4 - 2023-24 season) 01/26/2023   DTaP/Tdap/Td (2 - Td or Tdap) 12/12/2027   Hepatitis C Screening  Completed   HIV Screening  Completed   HPV VACCINES  Aged Out    Discussed health benefits of physical activity, and encouraged him to engage in regular exercise appropriate for his age and condition.  Problem List Items Addressed This Visit   None                No follow-ups on file.     Shirlee Latch, MD

## 2023-11-23 ENCOUNTER — Emergency Department
Admission: EM | Admit: 2023-11-23 | Discharge: 2023-11-23 | Disposition: A | Discharge status: Home / Self Care | Payer: Self-pay | Attending: Emergency Medicine | Admitting: Emergency Medicine

## 2023-11-23 DIAGNOSIS — D72829 Elevated white blood cell count, unspecified: Secondary | ICD-10-CM | POA: Insufficient documentation

## 2023-11-23 DIAGNOSIS — L03113 Cellulitis of right upper limb: Secondary | ICD-10-CM | POA: Insufficient documentation

## 2023-11-23 DIAGNOSIS — F1410 Cocaine abuse, uncomplicated: Secondary | ICD-10-CM | POA: Insufficient documentation

## 2023-11-23 LAB — BASIC METABOLIC PANEL WITH GFR
Anion gap: 6 (ref 5–15)
BUN: 12 mg/dL (ref 6–20)
CO2: 26 mmol/L (ref 22–32)
Calcium: 8.7 mg/dL — ABNORMAL LOW (ref 8.9–10.3)
Chloride: 108 mmol/L (ref 98–111)
Creatinine, Ser: 0.85 mg/dL (ref 0.61–1.24)
GFR, Estimated: >60 mL/min (ref >60)
Glucose, Bld: 89 mg/dL (ref 70–99)
Potassium: 3.8 mmol/L (ref 3.5–5.1)
Sodium: 140 mmol/L (ref 135–145)

## 2023-11-23 LAB — CBC WITH DIFFERENTIAL/PLATELET
Abs Immature Granulocytes: 0.05 10*3/uL (ref 0.00–0.07)
Basophils Absolute: 0.1 10*3/uL (ref 0.0–0.1)
Basophils Relative: 0 %
Eosinophils Absolute: 0.1 10*3/uL (ref 0.0–0.5)
Eosinophils Relative: 1 %
HCT: 47.5 % (ref 39.0–52.0)
Hemoglobin: 16.3 g/dL (ref 13.0–17.0)
Immature Granulocytes: 0 %
Lymphocytes Relative: 11 %
Lymphs Abs: 1.5 10*3/uL (ref 0.7–4.0)
MCH: 32.4 pg (ref 26.0–34.0)
MCHC: 34.3 g/dL (ref 30.0–36.0)
MCV: 94.4 fL (ref 80.0–100.0)
Monocytes Absolute: 0.5 10*3/uL (ref 0.1–1.0)
Monocytes Relative: 3 %
Neutro Abs: 12.3 10*3/uL — ABNORMAL HIGH (ref 1.7–7.7)
Neutrophils Relative %: 85 %
Platelets: 324 10*3/uL (ref 150–400)
RBC: 5.03 MIL/uL (ref 4.22–5.81)
RDW: 11.7 % (ref 11.5–15.5)
WBC: 14.5 10*3/uL — ABNORMAL HIGH (ref 4.0–10.5)
nRBC: 0 % (ref 0.0–0.2)

## 2023-11-23 MED ORDER — DOXYCYCLINE HYCLATE 100 MG PO CAPS: 100.0000 mg | ORAL_CAPSULE | Freq: Two times a day (BID) | ORAL | 0 refills | Status: AC

## 2023-11-23 NOTE — ED Provider Notes (Signed)
 [facility] Provider Note    Event Date/Time   First MD Initiated Contact with Patient 11/23/23 1825     (approximate)   History   detox   HPI  Joseph Cohen is a 43 y.o. male  with a past medical history of cocaine abuse who presents with concerns for parasitic infection. Patient states that he has been living in a friend's camper and when he had a bowel movement in a plastic bag, he was able to see a long, skinny white worm in his bowel movement. Patient also reports multiple areas on his skin, predominantly bilateral lower extremities, bilateral upper extremities and the back of his neck. States that these areas will burn and then explode with worms, and black larvae. Patient has not been walking outside barefoot, has been using purified water source.    Joseph Cohen is a 43 y.o. male with a past medical history of cocaine abuse who presents with concerns for parasitic infection. On exam, patient appears nontoxic, vitals are reassuring. Multiple raised excoriated lesions bilateral upper and lower extremities as well as in the posterior aspect of the neck. I am not able to observe the parasites the patient states is coming out of these. 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 Patient reports using crack cocaine today and he is anxious and fidgety on exam.       Physical Exam   Triage Vital Signs: ED Triage Vitals [11/23/23 1818]  Encounter Vitals Group     BP (!) 124/99     Girls Systolic BP Percentile      Girls Diastolic BP Percentile      Boys Systolic BP Percentile      Boys Diastolic BP Percentile      Pulse Rate 98     Resp 19     Temp 98 F (36.7 C)     Temp Source Oral     SpO2 99 %     Weight 118 lb (53.5 kg)     Height 5' 9 (1.753 m)     Head Circumference      Peak Flow      Pain Score 0     Pain Loc      Pain Education       Exclude from Growth Chart     Most recent vital signs: Vitals:   11/23/23 1818  BP: (!) 124/99  Pulse: 98  Resp: 19  Temp: 98 F (36.7 C)  SpO2: 99%    General: Awake, no distress.  CV:  Good peripheral perfusion.  Regular rate rhythm, normal distal pulses Resp:  Normal effort.  Abd:  No distention.  Other:  Multiple small excoriated areas on extremities.  1 cm superficial skin ulceration over the posterior neck without fluctuance or purulent drainage.  No crepitus or streaking.  No large erythematous areas.  No visible parasites or [place] or worms or eggs   ED Results / Procedures / Treatments   Labs (all labs ordered are listed, but only abnormal results are displayed) Labs Reviewed  CBC WITH DIFFERENTIAL/PLATELET - Abnormal; Notable for the following components:      Result Value   WBC 14.5 (*)    Neutro Abs 12.3 (*)    All other components within normal limits  BASIC METABOLIC PANEL WITH GFR - Abnormal; Notable for the following components:   Calcium 8.7 (*)  All other components within normal limits     RADIOLOGY    PROCEDURES:  Procedures   MEDICATIONS ORDERED IN ED: Medications - No data to display   IMPRESSION / MDM / ASSESSMENT AND PLAN / ED COURSE  I reviewed the triage vital signs and the nursing notes.                             Patient presents with complaint of parasites which I think are most likely cocaine induced delusions/formication.  He is nontoxic.  Because of his skin picking and scratching and poor hygiene, I am worried about secondary bacterial infection though mild.  He was recently treated with Keflex by [facility] ED.  Will treat with doxycycline.  He is interested in cocaine abstinence, will give information for [facility].       FINAL CLINICAL IMPRESSION(S) / ED DIAGNOSES   Final diagnoses:  Cellulitis of right upper extremity  Cocaine abuse (HCC)     Rx / DC Orders   ED Discharge Orders          Ordered    doxycycline  (VIBRAMYCIN) 100 MG capsule  2 times daily        11/23/23 1941             Note:  This document was prepared using Dragon voice recognition software and may include unintentional dictation errors.   Viviann Pastor, MD 11/23/23 1945

## 2023-11-23 NOTE — ED Triage Notes (Signed)
 Pt. Reports he is here for detox from crack/cocaine, also reports he has had bugs crawl out of wounds on his skin, he has photographs of the bugs and the wounds
